# Patient Record
Sex: Female | Born: 1945 | Race: White | Hispanic: No | Marital: Married | Smoking: Former smoker
Health system: Southern US, Community
[De-identification: ages and names within clinical notes are randomized; demographics above are authoritative.]

## PROBLEM LIST (undated history)

## (undated) DIAGNOSIS — F419 Anxiety disorder, unspecified: Secondary | ICD-10-CM

## (undated) DIAGNOSIS — I10 Essential (primary) hypertension: Secondary | ICD-10-CM

## (undated) DIAGNOSIS — I700 Atherosclerosis of aorta: Secondary | ICD-10-CM

## (undated) DIAGNOSIS — I2510 Atherosclerotic heart disease of native coronary artery without angina pectoris: Secondary | ICD-10-CM

## (undated) DIAGNOSIS — E119 Type 2 diabetes mellitus without complications: Secondary | ICD-10-CM

## (undated) DIAGNOSIS — E7800 Pure hypercholesterolemia, unspecified: Secondary | ICD-10-CM

## (undated) DIAGNOSIS — M5416 Radiculopathy, lumbar region: Secondary | ICD-10-CM

## (undated) DIAGNOSIS — M1990 Unspecified osteoarthritis, unspecified site: Secondary | ICD-10-CM

## (undated) DIAGNOSIS — K219 Gastro-esophageal reflux disease without esophagitis: Secondary | ICD-10-CM

## (undated) DIAGNOSIS — R011 Cardiac murmur, unspecified: Secondary | ICD-10-CM

## (undated) DIAGNOSIS — Z87891 Personal history of nicotine dependence: Secondary | ICD-10-CM

## (undated) DIAGNOSIS — N3941 Urge incontinence: Secondary | ICD-10-CM

## (undated) DIAGNOSIS — L309 Dermatitis, unspecified: Secondary | ICD-10-CM

## (undated) DIAGNOSIS — IMO0002 Reserved for concepts with insufficient information to code with codable children: Secondary | ICD-10-CM

## (undated) DIAGNOSIS — Z8719 Personal history of other diseases of the digestive system: Secondary | ICD-10-CM

## (undated) DIAGNOSIS — I491 Atrial premature depolarization: Secondary | ICD-10-CM

## (undated) DIAGNOSIS — F32A Depression, unspecified: Secondary | ICD-10-CM

## (undated) DIAGNOSIS — D3702 Neoplasm of uncertain behavior of tongue: Secondary | ICD-10-CM

## (undated) HISTORY — DX: Cardiac murmur, unspecified: R01.1

## (undated) HISTORY — DX: Atherosclerotic heart disease of native coronary artery without angina pectoris: I25.10

## (undated) HISTORY — PX: COLONOSCOPY: SHX174

## (undated) HISTORY — PX: UPPER GI ENDOSCOPY: SHX6162

## (undated) HISTORY — DX: Atherosclerosis of aorta: I70.0

---

## 1974-02-20 HISTORY — PX: ABDOMINAL HYSTERECTOMY: SHX81

## 1996-02-21 HISTORY — PX: CARPAL TUNNEL RELEASE: SHX101

## 1998-02-20 HISTORY — PX: BREAST BIOPSY: SHX20

## 2005-02-20 HISTORY — PX: CYSTOCELE REPAIR: SHX163

## 2005-02-20 HISTORY — PX: ROTATOR CUFF REPAIR: SHX139

## 2005-10-13 ENCOUNTER — Ambulatory Visit: Payer: Self-pay | Admitting: Unknown Physician Specialty

## 2006-02-15 ENCOUNTER — Inpatient Hospital Stay: Payer: Self-pay | Admitting: Obstetrics & Gynecology

## 2007-09-18 ENCOUNTER — Ambulatory Visit: Payer: Self-pay | Admitting: Family Medicine

## 2008-09-21 ENCOUNTER — Ambulatory Visit: Payer: Self-pay

## 2008-10-16 DIAGNOSIS — Z8241 Family history of sudden cardiac death: Secondary | ICD-10-CM | POA: Insufficient documentation

## 2008-11-09 ENCOUNTER — Ambulatory Visit: Payer: Self-pay | Admitting: Unknown Physician Specialty

## 2011-02-21 HISTORY — PX: KNEE ARTHROSCOPY: SUR90

## 2011-05-05 DIAGNOSIS — M1710 Unilateral primary osteoarthritis, unspecified knee: Secondary | ICD-10-CM | POA: Insufficient documentation

## 2011-08-16 ENCOUNTER — Ambulatory Visit: Payer: Self-pay | Admitting: Unknown Physician Specialty

## 2011-08-18 LAB — PATHOLOGY REPORT

## 2011-10-12 DIAGNOSIS — S83249A Other tear of medial meniscus, current injury, unspecified knee, initial encounter: Secondary | ICD-10-CM | POA: Insufficient documentation

## 2011-12-12 ENCOUNTER — Ambulatory Visit: Payer: Self-pay | Admitting: Family Medicine

## 2012-07-09 ENCOUNTER — Ambulatory Visit: Payer: Self-pay | Admitting: Family Medicine

## 2012-09-04 ENCOUNTER — Institutional Professional Consult (permissible substitution): Payer: Self-pay | Admitting: Internal Medicine

## 2012-09-05 ENCOUNTER — Institutional Professional Consult (permissible substitution): Payer: Self-pay | Admitting: Internal Medicine

## 2013-01-21 ENCOUNTER — Ambulatory Visit: Payer: Self-pay | Admitting: Otolaryngology

## 2013-01-27 ENCOUNTER — Ambulatory Visit: Payer: Self-pay | Admitting: Family Medicine

## 2014-03-19 DIAGNOSIS — K219 Gastro-esophageal reflux disease without esophagitis: Secondary | ICD-10-CM | POA: Diagnosis not present

## 2014-03-19 DIAGNOSIS — Z8719 Personal history of other diseases of the digestive system: Secondary | ICD-10-CM | POA: Diagnosis not present

## 2014-06-08 DIAGNOSIS — M25552 Pain in left hip: Secondary | ICD-10-CM | POA: Diagnosis not present

## 2014-08-06 ENCOUNTER — Other Ambulatory Visit: Payer: Self-pay | Admitting: Family Medicine

## 2014-08-06 DIAGNOSIS — Z1231 Encounter for screening mammogram for malignant neoplasm of breast: Secondary | ICD-10-CM

## 2014-08-11 ENCOUNTER — Other Ambulatory Visit: Payer: Self-pay | Admitting: Family Medicine

## 2014-08-11 ENCOUNTER — Ambulatory Visit
Admission: RE | Admit: 2014-08-11 | Discharge: 2014-08-11 | Disposition: A | Discharge status: Home / Self Care | Payer: Medicare Other | Source: Ambulatory Visit | Attending: Family Medicine | Admitting: Family Medicine

## 2014-08-11 DIAGNOSIS — Z1231 Encounter for screening mammogram for malignant neoplasm of breast: Secondary | ICD-10-CM | POA: Diagnosis not present

## 2014-08-12 ENCOUNTER — Telehealth: Payer: Self-pay

## 2014-08-12 NOTE — Telephone Encounter (Signed)
-----   Message from Margo Common, [place] sent at 08/11/2014  6:46 PM EDT ----- Normal mammograms. Radiologist recommended annual recheck with physical exam.

## 2014-08-12 NOTE — Telephone Encounter (Signed)
Patient advised as directed below. Patient verbalized understanding.  

## 2014-09-02 ENCOUNTER — Telehealth: Payer: Self-pay

## 2014-09-02 ENCOUNTER — Other Ambulatory Visit: Payer: Self-pay

## 2014-09-02 DIAGNOSIS — E7800 Pure hypercholesterolemia, unspecified: Secondary | ICD-10-CM

## 2014-09-02 NOTE — Telephone Encounter (Signed)
Patient states when she came in one Monday that you had mentioned it was time for her labs to be re checked. Patient is requesting a lab

## 2014-09-03 NOTE — Telephone Encounter (Signed)
Advise patient she is due for follow up of cholesterol and schedule follow up appointment to go over lab results in a week or two. Only need to come in to get nurse to release lab requisition before going down stairs for blood draw.

## 2014-09-03 NOTE — Telephone Encounter (Signed)
Patient advised as directed below. Patient verbalized understanding. Patient states she will pick up lab requisition today, printed at the front desk for pick up.

## 2014-09-04 ENCOUNTER — Other Ambulatory Visit: Payer: Self-pay | Admitting: Family Medicine

## 2014-09-05 LAB — CBC WITH DIFFERENTIAL/PLATELET
BASOS ABS: 0 10*3/uL (ref 0.0–0.2)
BASOS: 1 %
EOS (ABSOLUTE): 0.3 10*3/uL (ref 0.0–0.4)
Eos: 6 %
HEMATOCRIT: 38 % (ref 34.0–46.6)
HEMOGLOBIN: 12.8 g/dL (ref 11.1–15.9)
Immature Grans (Abs): 0 10*3/uL (ref 0.0–0.1)
Immature Granulocytes: 0 %
Lymphocytes Absolute: 1.8 10*3/uL (ref 0.7–3.1)
Lymphs: 38 %
MCH: 28.8 pg (ref 26.6–33.0)
MCHC: 33.7 g/dL (ref 31.5–35.7)
MCV: 86 fL (ref 79–97)
MONOS ABS: 0.4 10*3/uL (ref 0.1–0.9)
Monocytes: 10 %
NEUTROS ABS: 2.1 10*3/uL (ref 1.4–7.0)
NEUTROS PCT: 45 %
Platelets: 237 10*3/uL (ref 150–379)
RBC: 4.44 x10E6/uL (ref 3.77–5.28)
RDW: 13.1 % (ref 12.3–15.4)
WBC: 4.6 10*3/uL (ref 3.4–10.8)

## 2014-09-05 LAB — COMPREHENSIVE METABOLIC PANEL
ALBUMIN: 4.2 g/dL (ref 3.6–4.8)
ALT: 21 IU/L (ref 0–32)
AST: 20 IU/L (ref 0–40)
Albumin/Globulin Ratio: 1.4 (ref 1.1–2.5)
Alkaline Phosphatase: 77 IU/L (ref 39–117)
BUN/Creatinine Ratio: 13 (ref 11–26)
BUN: 13 mg/dL (ref 8–27)
Bilirubin Total: 0.3 mg/dL (ref 0.0–1.2)
CALCIUM: 9 mg/dL (ref 8.7–10.3)
CO2: 24 mmol/L (ref 18–29)
CREATININE: 1.01 mg/dL — AB (ref 0.57–1.00)
Chloride: 102 mmol/L (ref 97–108)
GFR calc Af Amer: 66 mL/min/{1.73_m2} (ref >59)
GFR, EST NON AFRICAN AMERICAN: 57 mL/min/{1.73_m2} — AB (ref >59)
GLOBULIN, TOTAL: 3.1 g/dL (ref 1.5–4.5)
GLUCOSE: 134 mg/dL — AB (ref 65–99)
Potassium: 4.6 mmol/L (ref 3.5–5.2)
Sodium: 141 mmol/L (ref 134–144)
TOTAL PROTEIN: 7.3 g/dL (ref 6.0–8.5)

## 2014-09-05 LAB — LIPID PANEL
CHOL/HDL RATIO: 7.3 ratio — AB (ref 0.0–4.4)
Cholesterol, Total: 248 mg/dL — ABNORMAL HIGH (ref 100–199)
HDL: 34 mg/dL — AB (ref >39)
LDL Calculated: 167 mg/dL — ABNORMAL HIGH (ref 0–99)
Triglycerides: 235 mg/dL — ABNORMAL HIGH (ref 0–149)
VLDL Cholesterol Cal: 47 mg/dL — ABNORMAL HIGH (ref 5–40)

## 2014-09-11 ENCOUNTER — Telehealth: Payer: Self-pay

## 2014-09-11 NOTE — Telephone Encounter (Signed)
Advised of results and agreed to call back to make an appointment.

## 2014-10-06 DIAGNOSIS — D3702 Neoplasm of uncertain behavior of tongue: Secondary | ICD-10-CM | POA: Insufficient documentation

## 2014-10-06 DIAGNOSIS — Z87891 Personal history of nicotine dependence: Secondary | ICD-10-CM | POA: Insufficient documentation

## 2014-10-06 DIAGNOSIS — K219 Gastro-esophageal reflux disease without esophagitis: Secondary | ICD-10-CM | POA: Insufficient documentation

## 2014-10-06 DIAGNOSIS — E785 Hyperlipidemia, unspecified: Secondary | ICD-10-CM | POA: Insufficient documentation

## 2014-10-08 ENCOUNTER — Encounter: Payer: Self-pay | Admitting: Family Medicine

## 2014-10-08 ENCOUNTER — Ambulatory Visit (INDEPENDENT_AMBULATORY_CARE_PROVIDER_SITE_OTHER): Payer: Medicare Other | Admitting: Family Medicine

## 2014-10-08 VITALS — BP 140/80 | HR 69 | Temp 98.7°F | Resp 16 | Wt 221.8 lb

## 2014-10-08 DIAGNOSIS — Z8241 Family history of sudden cardiac death: Secondary | ICD-10-CM | POA: Diagnosis not present

## 2014-10-08 DIAGNOSIS — E7800 Pure hypercholesterolemia, unspecified: Secondary | ICD-10-CM

## 2014-10-08 DIAGNOSIS — K209 Esophagitis, unspecified without bleeding: Secondary | ICD-10-CM | POA: Insufficient documentation

## 2014-10-08 DIAGNOSIS — I38 Endocarditis, valve unspecified: Secondary | ICD-10-CM | POA: Insufficient documentation

## 2014-10-08 DIAGNOSIS — K2270 Barrett's esophagus without dysplasia: Secondary | ICD-10-CM | POA: Insufficient documentation

## 2014-10-08 NOTE — Progress Notes (Signed)
Patient: Becky Gordon Female    DOB: 07/19/1945   69 y.o.   MRN: [id number] Visit Date: 10/08/2014  Today's Provider: Vernie Murders, PA   Chief Complaint  Patient presents with  . Follow-up    Cholesterol   Subjective:    HPI This 69 year old female presents for follow up of hyperlipidemia. Still on [place] and trying to follow low fat diet. Has some fatigue and DOE. 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 Plans follow up with Dr. Rockey Situ 11-17-14. Family history of cardiovascular disease in mother and 2 sisters.   Patient Active Problem List   Diagnosis Date Noted  . Acid reflux 10/06/2014  . Personal history of tobacco use, presenting hazards to health 10/06/2014  . HLD (hyperlipidemia) 10/06/2014  . 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 H/O gastrointestinal disease 03/19/2014  . Current tear knee, medial meniscus 10/12/2011  . Arthritis of knee, degenerative 05/05/2011  . Family history of sudden cardiac death (SCD) 2008-10-31  . Hypercholesterolemia without hypertriglyceridemia 08/17/2006   Past Surgical History  Procedure Laterality Date  . Breast biopsy Right 2000    negative  . Abdominal hysterectomy  1976  . Knee arthroscopy Left 2013  . Cystocele repair  2007  . Breast biopsy Right 2004  . Carpal tunnel release  1998  . Rotator cuff repair  2007   Family History  Problem Relation Age of Onset  . Heart attack Mother   . Cancer Father   . Heart attack Sister   . CAD Sister   . Kidney failure Sister   . Asthma Sister   . CAD Sister   . Heart disease Sister    Allergies  Allergen Reactions  . Celecoxib   . Etodolac   . Prednisone Hives   Previous Medications   ASPIRIN 81 MG EC TABLET    1 tablet daily.   BIOTIN 10 MG TABS    Take 1 tablet by mouth daily.   CALCIUM CARBONATE (OS-CAL) 600 MG TABS TABLET    Take 2 tablets by mouth daily.   CVS OMEGA-3 KRILL OIL PO    Take  by mouth daily.   FOLIC ACID (FOLVITE) 573 MCG TABLET    Take 1 tablet by mouth daily.   MULTIPLE VITAMIN PO    1 tablet daily.   OMEPRAZOLE 20 MG TBEC    Take 1 tablet by mouth daily.   RED YEAST RICE 600 MG CAPS    Take 1 capsule by mouth daily.   Review of Systems  Constitutional: Negative.   HENT: Negative.   Eyes: Negative.   Respiratory: Negative.   Cardiovascular: Negative.   Gastrointestinal: Negative.   Genitourinary: Negative.   Musculoskeletal: Negative.   Allergic/Immunologic: Negative.   Neurological: Negative.   Hematological: Negative.   Psychiatric/Behavioral: Negative.    Social History  Substance Use Topics  . Smoking status: Former Research scientist (life sciences)  . Smokeless tobacco: Never Used     Comment: QUIT IN 80  . Alcohol Use: No   Objective:   BP 140/80 mmHg  Pulse 69  Temp(Src) 98.7 F (37.1 C) (Oral)  Resp 16  Wt 221 lb 12.8 oz (100.608 kg)  Physical Exam  Constitutional: She is oriented to person, place, and time. She appears well-developed and well-nourished. No distress.  HENT:  Head: Normocephalic and atraumatic.  Right Ear: Hearing normal.  Left Ear: Hearing normal.  Nose:  Nose normal.  Eyes: Conjunctivae, EOM and lids are normal. Pupils are equal, round, and reactive to light. Right eye exhibits no discharge. Left eye exhibits no discharge. No scleral icterus.  Neck: Normal range of motion. Neck supple.  Cardiovascular: Normal rate and regular rhythm.   Pulmonary/Chest: Effort normal and breath sounds normal. No respiratory distress.  Abdominal: Soft. Bowel sounds are normal.  Musculoskeletal: Normal range of motion.  Mild crepitus in both knees. Minimal puffiness of knees and ankles. Good pulses throughout.  Neurological: She is alert and oriented to person, place, and time.  Skin: Skin is intact. No lesion and no rash noted.  Psychiatric: She has a normal mood and affect. Her speech is normal and behavior is normal. Thought content normal.        Assessment & Plan:     1. Hypercholesterolemia without hypertriglyceridemia Labs from 09-04-14 as documented below. Still not willing to consider statin for hyperlipidemia. Wants to try Red Yeast Rice and Omega-3 Krill Oil with diet for now. Encouraged to discuss with cardiologist (Dr. Candis Musa) Recent Results (from the past 2160 hour(s))  CBC with Differential/Platelet     Status: None   Collection Time: 09/04/14  8:28 AM  Result Value Ref Range   WBC 4.6 3.4 - 10.8 x10E3/uL   RBC 4.44 3.77 - 5.28 x10E6/uL   Hemoglobin 12.8 11.1 - 15.9 g/dL   Hematocrit 38.0 34.0 - 46.6 %   MCV 86 79 - 97 fL   MCH 28.8 26.6 - 33.0 pg   MCHC 33.7 31.5 - 35.7 g/dL   RDW 13.1 12.3 - 15.4 %   Platelets 237 150 - 379 x10E3/uL   Neutrophils 45 %   Lymphs 38 %   Monocytes 10 %   Eos 6 %   Basos 1 %   Neutrophils Absolute 2.1 1.4 - 7.0 x10E3/uL   Lymphocytes Absolute 1.8 0.7 - 3.1 x10E3/uL   Monocytes Absolute 0.4 0.1 - 0.9 x10E3/uL   EOS (ABSOLUTE) 0.3 0.0 - 0.4 x10E3/uL   Basophils Absolute 0.0 0.0 - 0.2 x10E3/uL   Immature Granulocytes 0 %   Immature Grans (Abs) 0.0 0.0 - 0.1 x10E3/uL  Comprehensive metabolic panel     Status: Abnormal   Collection Time: 09/04/14  8:28 AM  Result Value Ref Range   Glucose 134 (H) 65 - 99 mg/dL   BUN 13 8 - 27 mg/dL   Creatinine, Ser 1.01 (H) 0.57 - 1.00 mg/dL   GFR calc non Af Amer 57 (L) >59 mL/min/1.73   GFR calc Af Amer 66 >59 mL/min/1.73   BUN/Creatinine Ratio 13 11 - 26   Sodium 141 134 - 144 mmol/L   Potassium 4.6 3.5 - 5.2 mmol/L   Chloride 102 97 - 108 mmol/L   CO2 24 18 - 29 mmol/L   Calcium 9.0 8.7 - 10.3 mg/dL   Total Protein 7.3 6.0 - 8.5 g/dL   Albumin 4.2 3.6 - 4.8 g/dL   Globulin, Total 3.1 1.5 - 4.5 g/dL   Albumin/Globulin Ratio 1.4 1.1 - 2.5   Bilirubin Total 0.3 0.0 - 1.2 mg/dL   Alkaline Phosphatase 77 39 - 117 IU/L   AST 20 0 - 40 IU/L   ALT 21 0 - 32 IU/L  Lipid panel     Status: Abnormal   Collection Time: 09/04/14  8:28 AM  Result  Value Ref Range   Cholesterol, Total 248 (H) 100 - 199 mg/dL   Triglycerides 235 (H) 0 - 149 mg/dL  HDL 34 (L) >39 mg/dL    Comment: According to ATP-III Guidelines, HDL-C >59 mg/dL is considered a negative risk factor for CHD.    VLDL Cholesterol Cal 47 (H) 5 - 40 mg/dL   LDL Calculated 167 (H) 0 - 99 mg/dL   Chol/HDL Ratio 7.3 (H) 0.0 - 4.4 ratio units    Comment:                                   T. Chol/HDL Ratio                                             Men  Women                               1/2 Avg.Risk  3.4    3.3                                   Avg.Risk  5.0    4.4                                2X Avg.Risk  9.6    7.1                                3X Avg.Risk 23.4   11.0     2. Family history of sudden cardiac death (SCD) Mother and sisters have had SCD. Has a history of cardiovalvular disease. Will recheck with Dr. Rockey Situ (cardiologist) in a month.  3. Barrett's esophagus with esophagitis Diagnosed by EGD per Dr. Vira Agar (gastroenterologist) in 2013. Continues to use Omeprazole 20 mg qd to control any dyspepsia/reflux.  4. 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 Proceed with cardiology follow up as planned.       Vernie Murders, PA  [facility]

## 2014-11-17 ENCOUNTER — Encounter: Payer: Self-pay | Admitting: Cardiovascular Disease

## 2014-11-17 ENCOUNTER — Ambulatory Visit (INDEPENDENT_AMBULATORY_CARE_PROVIDER_SITE_OTHER): Payer: Medicare Other | Admitting: Cardiovascular Disease

## 2014-11-17 ENCOUNTER — Encounter (INDEPENDENT_AMBULATORY_CARE_PROVIDER_SITE_OTHER): Payer: Self-pay

## 2014-11-17 VITALS — BP 158/70 | HR 67 | Ht 65.0 in | Wt 221.5 lb

## 2014-11-17 DIAGNOSIS — R079 Chest pain, unspecified: Secondary | ICD-10-CM | POA: Diagnosis not present

## 2014-11-17 DIAGNOSIS — R519 Headache, unspecified: Secondary | ICD-10-CM

## 2014-11-17 DIAGNOSIS — R000 Tachycardia, unspecified: Secondary | ICD-10-CM

## 2014-11-17 DIAGNOSIS — E785 Hyperlipidemia, unspecified: Secondary | ICD-10-CM

## 2014-11-17 DIAGNOSIS — Z87891 Personal history of nicotine dependence: Secondary | ICD-10-CM

## 2014-11-17 DIAGNOSIS — R51 Headache: Secondary | ICD-10-CM

## 2014-11-17 DIAGNOSIS — I209 Angina pectoris, unspecified: Secondary | ICD-10-CM

## 2014-11-17 DIAGNOSIS — R0602 Shortness of breath: Secondary | ICD-10-CM | POA: Diagnosis not present

## 2014-11-17 NOTE — Assessment & Plan Note (Signed)
Long smoking history places her at increased risk of underlying coronary artery disease and PAD This was discussed with her

## 2014-11-17 NOTE — Progress Notes (Signed)
Patient ID: Becky Gordon, female    DOB: 09/20/1945, 69 y.o.   MRN: [id number]  HPI Comments: Becky Gordon is a pleasant 69 year old woman with long history of smoking for 40 years, hyperlipidemia, anxiety, strong family history of coronary artery disease, presenting with symptoms of shortness of breath with exertion, discomfort in her jaw/face with walking.  She reports that she is concerned about coronary artery disease given her strong family history. In the past she has declined a cholesterol medication. Previously had trouble on Lipitor. Reports having significant GERD symptoms. She was taking fish oil at the same time. Currently she takes high-dose fish oil and complains of the burping and fish taste.  She's not reactive, reports that she is limited by her shortness of breath when she exerts herself. She is able to work for long hours in her garden, , more difficulty with aerobic activity. Sometimes when she exerts herself, she has discomfort up in her face.  Because of the symptoms she does not do any regular walking or exercise. She has gained significant weight over the years. Particularly since she stopped smoking  He denies any chest discomfort with exertion, only limited by her breathing. She reports previous attempt for treadmill study but was unsuccessful secondary to shortness of breath and had to stop the test.  EKG on today's visit shows normal sinus rhythm with rate 67 bpm, no significant ST or T-wave changes  In terms of her family history, she reports mother had major MI at age 70, sister had cardiac arrhythmia and died, father had cancer   Allergies  Allergen Reactions  . Celecoxib   . Etodolac   . Prednisone Hives    Current Outpatient Prescriptions on File Prior to Visit  Medication Sig Dispense Refill  . aspirin 81 MG EC tablet 1 tablet daily.    . Biotin 10 MG TABS Take 1 tablet by mouth daily.    . calcium carbonate (OS-CAL) 600 MG TABS tablet Take 2  tablets by mouth daily.    . CVS OMEGA-3 KRILL OIL PO Take by mouth daily.    . folic acid (FOLVITE) 937 MCG tablet Take 1 tablet by mouth daily.    . MULTIPLE VITAMIN PO 1 tablet daily.    . Omeprazole 20 MG TBEC Take 1 tablet by mouth daily.     No current facility-administered medications on file prior to visit.    History reviewed. No pertinent past medical history.  Past Surgical History  Procedure Laterality Date  . Breast biopsy Right 2000    negative  . Abdominal hysterectomy  1976  . Knee arthroscopy Left 2013  . Cystocele repair  2007  . Breast biopsy Right 2004  . Carpal tunnel release  1998  . Rotator cuff repair  2007    Social History  reports that she has quit smoking. Her smoking use included Cigarettes. She quit after 0 years of use. She has never used smokeless tobacco. She reports that she does not drink alcohol or use illicit drugs.  Family History family history includes Asthma in her sister; CAD in her sister and sister; Cancer in her father; Heart attack in her mother and sister; Heart disease in her sister; Kidney failure in her sister.    Review of Systems  Constitutional: Negative.        Tightness in her face with exertion  Respiratory: Positive for shortness of breath.   Cardiovascular: Negative.   Gastrointestinal: Negative.   Musculoskeletal: Negative.  Neurological: Negative.   Hematological: Negative.   Psychiatric/Behavioral: Negative.   All other systems reviewed and are negative.   BP 158/70 mmHg  Pulse 67  Ht 5\' 5"  (1.651 m)  Wt 221 lb 8 oz (100.472 kg)  BMI 36.86 kg/m2  Physical Exam  Constitutional: She is oriented to person, place, and time. She appears well-developed and well-nourished.  Obese  HENT:  Head: Normocephalic.  Nose: Nose normal.  Mouth/Throat: Oropharynx is clear and moist.  Eyes: Conjunctivae are normal. Pupils are equal, round, and reactive to light.  Neck: Normal range of motion. Neck supple. No JVD  present.  Cardiovascular: Normal rate, regular rhythm, normal heart sounds and intact distal pulses.  Exam reveals no gallop and no friction rub.   No murmur heard. Pulmonary/Chest: Effort normal and breath sounds normal. No respiratory distress. She has no wheezes. She has no rales. She exhibits no tenderness.  Abdominal: Soft. Bowel sounds are normal. She exhibits no distension. There is no tenderness.  Musculoskeletal: Normal range of motion. She exhibits no edema or tenderness.  Lymphadenopathy:    She has no cervical adenopathy.  Neurological: She is alert and oriented to person, place, and time. Coordination normal.  Skin: Skin is warm and dry. No rash noted. No erythema.  Psychiatric: She has a normal mood and affect. Her behavior is normal. Judgment and thought content normal.

## 2014-11-17 NOTE — Assessment & Plan Note (Signed)
Discussed her high cholesterol with her. Will use the calcium scores a guide and could start low-dose Crestor for elevated calcium score

## 2014-11-17 NOTE — Patient Instructions (Addendum)
  No medication changes were made.  You are scheduled for a CT coronary calcium score in [place] for chest pain, shortness of breath, angina pectoris Wednesday,Sept 28 @ 4:00, please arrive @ 3:45 There is a one-time fee of $150.00 due at the time of your procedure  Please call us if you have new issues that need to be addressed before your next appt.  Your physician wants you to follow-up in: 1 month.     Coronary Calcium Scan A coronary calcium scan is an imaging test used to look for deposits of calcium and other fatty materials (plaques) in the inner lining of the blood vessels of your heart (coronary arteries). These deposits of calcium and plaques can partly clog and narrow the coronary arteries without producing any symptoms or warning signs. This puts you at risk for a heart attack. This test can detect these deposits before symptoms develop.  LET [facility] CARE PROVIDER KNOW ABOUT:  Any allergies you have.  All medicines you are taking, including vitamins, herbs, eye drops, creams, and over-the-counter medicines.  Previous problems you or members of your family have had with the use of anesthetics.  Any blood disorders you have.  Previous surgeries you have had.  Medical conditions you have.  Possibility of pregnancy, if this applies. RISKS AND COMPLICATIONS Generally, this is a safe procedure. However, as with any procedure, complications can occur. This test involves the use of radiation. Radiation exposure can be dangerous to a pregnant woman and her unborn baby. If you are pregnant, you should not have this procedure done.  BEFORE THE PROCEDURE There is no special preparation for the procedure. PROCEDURE  You will need to undress and put on a hospital gown. You will need to remove any jewelry around your neck or chest.  Sticky electrodes are placed on your chest and are connected to an electrocardiogram (EKG or electrocardiography) machine to recorda tracing of the  electrical activity of your heart.  A CT scanner will take pictures of your heart. During this time, you will be asked to lie still and hold your breath for 2-3 seconds while a picture is being taken of your heart. AFTER THE PROCEDURE   You will be allowed to get dressed.  You can return to your normal activities after the scan is done. Document Released: 08/05/2007 Document Revised: 02/11/2013 Document Reviewed: 10/14/2012 [facility] Patient Information 2015 [place], [place]. This information is not intended to replace advice given to you by your health care provider. Make sure you discuss any questions you have with your health care provider.

## 2014-11-17 NOTE — Assessment & Plan Note (Signed)
Etiology of her shortness of breath is unclear She is obese, deconditioned Unable to exclude underlying coronary disease/angina as a cause of her symptoms We'll try to obtain prior records from [place]. She reports having a stress test but was unable to treadmill and test was terminated. After long discussion, given her risk factors and family history, we have recommended a CT coronary calcium score for risk stratification. Pharmacologic Myoview could be done but would be limited by breast attenuation artifact

## 2014-11-17 NOTE — Assessment & Plan Note (Signed)
She is concerned about her symptoms and states being unable to feel comfortable doing an exercise program. If the scan above shows minimal calcified coronary atherosclerosis, would encourage her to start a regular exercise program for weight loss

## 2014-11-17 NOTE — Assessment & Plan Note (Signed)
Etiology of her discomfort in the face with exertion is unclear. Unable to exclude angina equivalent.  CT coronary calcium score ordered as above. Recommended if symptoms get worse that she call our office.

## 2014-11-18 ENCOUNTER — Ambulatory Visit (INDEPENDENT_AMBULATORY_CARE_PROVIDER_SITE_OTHER)
Admission: RE | Admit: 2014-11-18 | Discharge: 2014-11-18 | Disposition: A | Discharge status: Home / Self Care | Payer: Medicare Other | Source: Ambulatory Visit | Attending: Cardiovascular Disease | Admitting: Cardiovascular Disease

## 2014-11-18 DIAGNOSIS — R000 Tachycardia, unspecified: Secondary | ICD-10-CM

## 2014-11-18 DIAGNOSIS — R079 Chest pain, unspecified: Secondary | ICD-10-CM

## 2014-11-18 DIAGNOSIS — R0602 Shortness of breath: Secondary | ICD-10-CM

## 2014-11-18 DIAGNOSIS — I209 Angina pectoris, unspecified: Secondary | ICD-10-CM

## 2014-11-25 ENCOUNTER — Other Ambulatory Visit: Payer: No Typology Code available for payment source

## 2015-01-05 ENCOUNTER — Ambulatory Visit: Payer: No Typology Code available for payment source | Admitting: Cardiovascular Disease

## 2015-08-27 DIAGNOSIS — K219 Gastro-esophageal reflux disease without esophagitis: Secondary | ICD-10-CM | POA: Diagnosis not present

## 2015-08-27 DIAGNOSIS — I10 Essential (primary) hypertension: Secondary | ICD-10-CM | POA: Diagnosis not present

## 2015-08-27 DIAGNOSIS — E669 Obesity, unspecified: Secondary | ICD-10-CM | POA: Diagnosis not present

## 2015-08-27 DIAGNOSIS — E784 Other hyperlipidemia: Secondary | ICD-10-CM | POA: Diagnosis not present

## 2015-09-01 DIAGNOSIS — K219 Gastro-esophageal reflux disease without esophagitis: Secondary | ICD-10-CM | POA: Diagnosis not present

## 2015-09-01 DIAGNOSIS — E784 Other hyperlipidemia: Secondary | ICD-10-CM | POA: Diagnosis not present

## 2015-09-01 DIAGNOSIS — I10 Essential (primary) hypertension: Secondary | ICD-10-CM | POA: Diagnosis not present

## 2015-09-01 DIAGNOSIS — E669 Obesity, unspecified: Secondary | ICD-10-CM | POA: Diagnosis not present

## 2015-09-01 DIAGNOSIS — M810 Age-related osteoporosis without current pathological fracture: Secondary | ICD-10-CM | POA: Diagnosis not present

## 2015-09-01 DIAGNOSIS — M8580 Other specified disorders of bone density and structure, unspecified site: Secondary | ICD-10-CM | POA: Diagnosis not present

## 2015-09-01 LAB — HM DEXA SCAN: HM Dexa Scan: NORMAL

## 2015-09-03 ENCOUNTER — Other Ambulatory Visit: Payer: Self-pay | Admitting: Family Medicine

## 2015-09-03 DIAGNOSIS — E784 Other hyperlipidemia: Secondary | ICD-10-CM | POA: Diagnosis not present

## 2015-09-03 DIAGNOSIS — K219 Gastro-esophageal reflux disease without esophagitis: Secondary | ICD-10-CM | POA: Diagnosis not present

## 2015-09-03 DIAGNOSIS — E669 Obesity, unspecified: Secondary | ICD-10-CM | POA: Diagnosis not present

## 2015-09-03 DIAGNOSIS — I10 Essential (primary) hypertension: Secondary | ICD-10-CM | POA: Diagnosis not present

## 2015-09-03 DIAGNOSIS — Z1231 Encounter for screening mammogram for malignant neoplasm of breast: Secondary | ICD-10-CM

## 2015-09-03 DIAGNOSIS — M8580 Other specified disorders of bone density and structure, unspecified site: Secondary | ICD-10-CM | POA: Diagnosis not present

## 2015-09-21 ENCOUNTER — Ambulatory Visit
Admission: RE | Admit: 2015-09-21 | Discharge: 2015-09-21 | Disposition: A | Discharge status: Home / Self Care | Payer: Medicare Other | Source: Ambulatory Visit | Attending: Family Medicine | Admitting: Family Medicine

## 2015-09-21 ENCOUNTER — Other Ambulatory Visit: Payer: Self-pay | Admitting: Family Medicine

## 2015-09-21 DIAGNOSIS — Z1231 Encounter for screening mammogram for malignant neoplasm of breast: Secondary | ICD-10-CM | POA: Diagnosis not present

## 2015-10-26 ENCOUNTER — Other Ambulatory Visit: Payer: Self-pay

## 2015-11-25 DIAGNOSIS — I10 Essential (primary) hypertension: Secondary | ICD-10-CM | POA: Diagnosis not present

## 2015-11-25 DIAGNOSIS — E784 Other hyperlipidemia: Secondary | ICD-10-CM | POA: Diagnosis not present

## 2015-11-25 DIAGNOSIS — K219 Gastro-esophageal reflux disease without esophagitis: Secondary | ICD-10-CM | POA: Diagnosis not present

## 2015-11-25 DIAGNOSIS — E669 Obesity, unspecified: Secondary | ICD-10-CM | POA: Diagnosis not present

## 2015-11-30 ENCOUNTER — Telehealth: Payer: Self-pay | Admitting: Family Medicine

## 2015-11-30 NOTE — Telephone Encounter (Signed)
Called Pt to schedule AWV with [facility] and CPE with PCP for 10/17 -knb

## 2015-12-01 DIAGNOSIS — E784 Other hyperlipidemia: Secondary | ICD-10-CM | POA: Diagnosis not present

## 2015-12-01 DIAGNOSIS — E1165 Type 2 diabetes mellitus with hyperglycemia: Secondary | ICD-10-CM | POA: Diagnosis not present

## 2015-12-01 DIAGNOSIS — I10 Essential (primary) hypertension: Secondary | ICD-10-CM | POA: Diagnosis not present

## 2015-12-01 DIAGNOSIS — M8580 Other specified disorders of bone density and structure, unspecified site: Secondary | ICD-10-CM | POA: Diagnosis not present

## 2015-12-01 DIAGNOSIS — E669 Obesity, unspecified: Secondary | ICD-10-CM | POA: Diagnosis not present

## 2015-12-01 DIAGNOSIS — E118 Type 2 diabetes mellitus with unspecified complications: Secondary | ICD-10-CM | POA: Diagnosis not present

## 2015-12-01 DIAGNOSIS — R739 Hyperglycemia, unspecified: Secondary | ICD-10-CM | POA: Diagnosis not present

## 2015-12-01 DIAGNOSIS — K219 Gastro-esophageal reflux disease without esophagitis: Secondary | ICD-10-CM | POA: Diagnosis not present

## 2015-12-14 DIAGNOSIS — E784 Other hyperlipidemia: Secondary | ICD-10-CM | POA: Diagnosis not present

## 2015-12-14 DIAGNOSIS — M8580 Other specified disorders of bone density and structure, unspecified site: Secondary | ICD-10-CM | POA: Diagnosis not present

## 2015-12-14 DIAGNOSIS — I10 Essential (primary) hypertension: Secondary | ICD-10-CM | POA: Diagnosis not present

## 2015-12-14 DIAGNOSIS — K219 Gastro-esophageal reflux disease without esophagitis: Secondary | ICD-10-CM | POA: Diagnosis not present

## 2015-12-14 DIAGNOSIS — E1165 Type 2 diabetes mellitus with hyperglycemia: Secondary | ICD-10-CM | POA: Diagnosis not present

## 2015-12-14 DIAGNOSIS — E118 Type 2 diabetes mellitus with unspecified complications: Secondary | ICD-10-CM | POA: Diagnosis not present

## 2015-12-14 DIAGNOSIS — E669 Obesity, unspecified: Secondary | ICD-10-CM | POA: Diagnosis not present

## 2015-12-28 DIAGNOSIS — E669 Obesity, unspecified: Secondary | ICD-10-CM | POA: Diagnosis not present

## 2015-12-28 DIAGNOSIS — M8580 Other specified disorders of bone density and structure, unspecified site: Secondary | ICD-10-CM | POA: Diagnosis not present

## 2015-12-28 DIAGNOSIS — I10 Essential (primary) hypertension: Secondary | ICD-10-CM | POA: Diagnosis not present

## 2015-12-28 DIAGNOSIS — E118 Type 2 diabetes mellitus with unspecified complications: Secondary | ICD-10-CM | POA: Diagnosis not present

## 2015-12-28 DIAGNOSIS — E1165 Type 2 diabetes mellitus with hyperglycemia: Secondary | ICD-10-CM | POA: Diagnosis not present

## 2015-12-28 DIAGNOSIS — K219 Gastro-esophageal reflux disease without esophagitis: Secondary | ICD-10-CM | POA: Diagnosis not present

## 2015-12-28 DIAGNOSIS — E784 Other hyperlipidemia: Secondary | ICD-10-CM | POA: Diagnosis not present

## 2016-01-05 DIAGNOSIS — Z1211 Encounter for screening for malignant neoplasm of colon: Secondary | ICD-10-CM | POA: Diagnosis not present

## 2016-02-02 ENCOUNTER — Telehealth: Payer: Self-pay | Admitting: Family Medicine

## 2016-02-02 NOTE — Telephone Encounter (Signed)
Called Pt to schedule AWV with [facility] - knb °

## 2016-02-07 DIAGNOSIS — E1165 Type 2 diabetes mellitus with hyperglycemia: Secondary | ICD-10-CM | POA: Diagnosis not present

## 2016-02-15 DIAGNOSIS — K219 Gastro-esophageal reflux disease without esophagitis: Secondary | ICD-10-CM | POA: Diagnosis not present

## 2016-02-15 DIAGNOSIS — E1165 Type 2 diabetes mellitus with hyperglycemia: Secondary | ICD-10-CM | POA: Diagnosis not present

## 2016-02-15 DIAGNOSIS — E784 Other hyperlipidemia: Secondary | ICD-10-CM | POA: Diagnosis not present

## 2016-02-15 DIAGNOSIS — I10 Essential (primary) hypertension: Secondary | ICD-10-CM | POA: Diagnosis not present

## 2016-02-15 DIAGNOSIS — M8580 Other specified disorders of bone density and structure, unspecified site: Secondary | ICD-10-CM | POA: Diagnosis not present

## 2016-02-15 DIAGNOSIS — E118 Type 2 diabetes mellitus with unspecified complications: Secondary | ICD-10-CM | POA: Diagnosis not present

## 2016-02-15 DIAGNOSIS — E669 Obesity, unspecified: Secondary | ICD-10-CM | POA: Diagnosis not present

## 2016-05-16 ENCOUNTER — Ambulatory Visit (INDEPENDENT_AMBULATORY_CARE_PROVIDER_SITE_OTHER): Payer: Medicare Other | Admitting: Family Medicine

## 2016-05-16 ENCOUNTER — Encounter: Payer: Self-pay | Admitting: Family Medicine

## 2016-05-16 ENCOUNTER — Other Ambulatory Visit: Payer: Self-pay

## 2016-05-16 ENCOUNTER — Telehealth: Payer: Self-pay | Admitting: Family Medicine

## 2016-05-16 VITALS — BP 138/78 | HR 64 | Temp 98.0°F | Wt 207.2 lb

## 2016-05-16 DIAGNOSIS — E669 Obesity, unspecified: Secondary | ICD-10-CM | POA: Diagnosis not present

## 2016-05-16 DIAGNOSIS — N6310 Unspecified lump in the right breast, unspecified quadrant: Secondary | ICD-10-CM

## 2016-05-16 DIAGNOSIS — E1165 Type 2 diabetes mellitus with hyperglycemia: Secondary | ICD-10-CM | POA: Diagnosis not present

## 2016-05-16 DIAGNOSIS — M8580 Other specified disorders of bone density and structure, unspecified site: Secondary | ICD-10-CM | POA: Diagnosis not present

## 2016-05-16 DIAGNOSIS — E784 Other hyperlipidemia: Secondary | ICD-10-CM | POA: Diagnosis not present

## 2016-05-16 NOTE — Telephone Encounter (Signed)
Mammogram ordered

## 2016-05-16 NOTE — Telephone Encounter (Signed)
Per York Cerise at [facility] a diagnostic right breast mammogram order is needed [id number]

## 2016-05-16 NOTE — Telephone Encounter (Signed)
Please order

## 2016-05-16 NOTE — Progress Notes (Addendum)
Patient: Becky Gordon Female    DOB: [birth year]   71 y.o.   MRN: [id number] Visit Date: 05/16/2016  Today's Provider: Vernie Murders, PA   Chief Complaint  Patient presents with  . Breast Mass   Subjective:    HPI Patient is here to discuss a possible right breast lump. Patient reports she noticed soreness and a knot last week, but no pain. Last mammogram was 09/2015.  Patient Active Problem List   Diagnosis Date Noted  . Morbid obesity ([place]) 11/17/2014  . Shortness of breath 11/17/2014  . Discomfort of face 11/17/2014  . Barrett's esophagus with esophagitis 10/08/2014  . Valvular disease 10/08/2014  . Acid reflux 10/06/2014  . Personal history of tobacco use, presenting hazards to health 10/06/2014  . HLD (hyperlipidemia) 10/06/2014  . 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 H/O gastrointestinal disease 03/19/2014  . Current tear knee, medial meniscus 10/12/2011  . Arthritis of knee, degenerative 05/05/2011  . Family history of sudden cardiac death (SCD) 10-29-2008  . Hypercholesterolemia without hypertriglyceridemia 08/17/2006   Past Surgical History:  Procedure Laterality Date  . ABDOMINAL HYSTERECTOMY  1976  . BREAST BIOPSY Right 2000   negative  . BREAST BIOPSY Right 2004  . CARPAL TUNNEL RELEASE  1998  . CYSTOCELE REPAIR  2007  . KNEE ARTHROSCOPY Left 2013  . ROTATOR CUFF REPAIR  2007   Family History  Problem Relation Age of Onset  . Heart attack Mother   . Cancer Father   . Heart attack Sister   . CAD Sister   . Kidney failure Sister   . Asthma Sister   . CAD Sister   . Heart disease Sister   . Breast cancer Neg Hx    Allergies  Allergen Reactions  . Celecoxib   . Etodolac   . Prednisone Hives     Previous Medications   AMLODIPINE (NORVASC) 5 MG TABLET    Take by mouth.   ASPIRIN 81 MG EC TABLET    1 tablet daily.   BIOTIN 10 MG TABS    Take 1 tablet by mouth daily.   CALCIUM CARBONATE (OS-CAL) 600 MG TABS TABLET    Take 2  tablets by mouth daily.   CVS OMEGA-3 KRILL OIL PO    Take by mouth daily.   EMPAGLIFLOZIN-METFORMIN HCL 5-500 MG TABS    Take by mouth.   FOLIC ACID (FOLVITE) 790 MCG TABLET    Take 1 tablet by mouth daily.   MULTIPLE VITAMIN PO    1 tablet daily.   OMEPRAZOLE 20 MG TBEC    Take 1 tablet by mouth daily.   SIMVASTATIN (ZOCOR) 40 MG TABLET    Take by mouth.    Review of Systems  Constitutional: Negative.   Respiratory: Negative.   Cardiovascular: Negative.   Skin:       Breast lump     Social History  Substance Use Topics  . Smoking status: Former Smoker    Years: 0.00    Types: Cigarettes  . Smokeless tobacco: Never Used     Comment: QUIT IN 32  . Alcohol use No   Objective:   BP 138/78 (BP Location: Right Arm, Patient Position: Sitting, Cuff Size: Normal)   Pulse 64   Temp 98 F (36.7 C) (Oral)   Wt 207 lb 3.2 oz (94 kg)   SpO2 97%   BMI 34.48 kg/m   Physical Exam  Constitutional: She is oriented to person, place, and time.  She appears well-developed and well-nourished. No distress.  HENT:  Head: Normocephalic and atraumatic.  Right Ear: Hearing normal.  Left Ear: Hearing normal.  Nose: Nose normal.  Eyes: Conjunctivae and lids are normal. Right eye exhibits no discharge. Left eye exhibits no discharge. No scleral icterus.  Pulmonary/Chest: Effort normal. No respiratory distress.  Tender right breast mass at deep 3 o'clock position. No nipple discharge or local lymphadenopathy.  Musculoskeletal: Normal range of motion.  Neurological: She is alert and oriented to person, place, and time.  Skin: Skin is intact. No lesion and no rash noted.  Psychiatric: She has a normal mood and affect. Her speech is normal and behavior is normal. Thought content normal.   Depression screen [facility] 2/9 05/16/2016  Decreased Interest 0  Down, Depressed, Hopeless 0  PHQ - 2 Score 0  Altered sleeping 0  Tired, decreased energy 0  Change in appetite 0  Feeling bad or failure about  yourself  0  Trouble concentrating 0  Moving slowly or fidgety/restless 0  Suicidal thoughts 0  PHQ-9 Score 0  Difficult doing work/chores Not difficult at all       Assessment & Plan:     1. Breast mass, right First noticed the past week. Had normal screening mammograms 09-21-15. No nipple discharge and worried about a cancer. Family history of negative for breast cancer. Has a history of right breast lump biopsies in 2000 and 2004 (both negative). Schedule ultrasound evaluation. Restrict coffee/caffeine in diet. Recheck pending report. - US BREAST LTD UNI RIGHT INC AXILLA

## 2016-05-24 DIAGNOSIS — M8580 Other specified disorders of bone density and structure, unspecified site: Secondary | ICD-10-CM | POA: Diagnosis not present

## 2016-05-24 DIAGNOSIS — E1165 Type 2 diabetes mellitus with hyperglycemia: Secondary | ICD-10-CM | POA: Diagnosis not present

## 2016-05-24 DIAGNOSIS — E118 Type 2 diabetes mellitus with unspecified complications: Secondary | ICD-10-CM | POA: Diagnosis not present

## 2016-05-24 DIAGNOSIS — E784 Other hyperlipidemia: Secondary | ICD-10-CM | POA: Diagnosis not present

## 2016-05-24 DIAGNOSIS — K219 Gastro-esophageal reflux disease without esophagitis: Secondary | ICD-10-CM | POA: Diagnosis not present

## 2016-05-24 DIAGNOSIS — I10 Essential (primary) hypertension: Secondary | ICD-10-CM | POA: Diagnosis not present

## 2016-05-24 DIAGNOSIS — E669 Obesity, unspecified: Secondary | ICD-10-CM | POA: Diagnosis not present

## 2016-05-29 ENCOUNTER — Ambulatory Visit
Admission: RE | Admit: 2016-05-29 | Discharge: 2016-05-29 | Disposition: A | Discharge status: Home / Self Care | Payer: Medicare Other | Source: Ambulatory Visit | Attending: Family Medicine | Admitting: Family Medicine

## 2016-05-29 DIAGNOSIS — N6310 Unspecified lump in the right breast, unspecified quadrant: Secondary | ICD-10-CM | POA: Diagnosis not present

## 2016-05-29 DIAGNOSIS — R928 Other abnormal and inconclusive findings on diagnostic imaging of breast: Secondary | ICD-10-CM | POA: Diagnosis not present

## 2016-05-29 NOTE — Progress Notes (Signed)
Advised  ED 

## 2016-06-02 ENCOUNTER — Encounter: Payer: Self-pay | Admitting: *Deleted

## 2016-06-05 ENCOUNTER — Ambulatory Visit: Payer: Medicare Other | Admitting: Certified Registered Nurse Anesthetist

## 2016-06-05 ENCOUNTER — Ambulatory Visit
Admission: RE | Admit: 2016-06-05 | Discharge: 2016-06-05 | Disposition: A | Discharge status: Home / Self Care | Payer: Medicare Other | Source: Ambulatory Visit | Attending: Unknown Physician Specialty | Admitting: Unknown Physician Specialty

## 2016-06-05 ENCOUNTER — Encounter: Payer: Self-pay | Admitting: Certified Registered Nurse Anesthetist

## 2016-06-05 ENCOUNTER — Encounter
Admission: RE | Disposition: A | Discharge status: Home / Self Care | Payer: Self-pay | Source: Ambulatory Visit | Attending: Unknown Physician Specialty

## 2016-06-05 DIAGNOSIS — Z1211 Encounter for screening for malignant neoplasm of colon: Secondary | ICD-10-CM | POA: Diagnosis not present

## 2016-06-05 DIAGNOSIS — K635 Polyp of colon: Secondary | ICD-10-CM | POA: Diagnosis not present

## 2016-06-05 DIAGNOSIS — K649 Unspecified hemorrhoids: Secondary | ICD-10-CM | POA: Diagnosis not present

## 2016-06-05 DIAGNOSIS — E119 Type 2 diabetes mellitus without complications: Secondary | ICD-10-CM | POA: Diagnosis not present

## 2016-06-05 DIAGNOSIS — K648 Other hemorrhoids: Secondary | ICD-10-CM | POA: Diagnosis not present

## 2016-06-05 DIAGNOSIS — K640 First degree hemorrhoids: Secondary | ICD-10-CM | POA: Diagnosis not present

## 2016-06-05 DIAGNOSIS — Z7982 Long term (current) use of aspirin: Secondary | ICD-10-CM | POA: Insufficient documentation

## 2016-06-05 DIAGNOSIS — K621 Rectal polyp: Secondary | ICD-10-CM | POA: Diagnosis not present

## 2016-06-05 DIAGNOSIS — K644 Residual hemorrhoidal skin tags: Secondary | ICD-10-CM | POA: Diagnosis not present

## 2016-06-05 DIAGNOSIS — M1990 Unspecified osteoarthritis, unspecified site: Secondary | ICD-10-CM | POA: Insufficient documentation

## 2016-06-05 DIAGNOSIS — Z90710 Acquired absence of both cervix and uterus: Secondary | ICD-10-CM | POA: Insufficient documentation

## 2016-06-05 DIAGNOSIS — K219 Gastro-esophageal reflux disease without esophagitis: Secondary | ICD-10-CM | POA: Diagnosis not present

## 2016-06-05 DIAGNOSIS — Z87891 Personal history of nicotine dependence: Secondary | ICD-10-CM | POA: Diagnosis not present

## 2016-06-05 DIAGNOSIS — D125 Benign neoplasm of sigmoid colon: Secondary | ICD-10-CM | POA: Diagnosis not present

## 2016-06-05 DIAGNOSIS — Z8249 Family history of ischemic heart disease and other diseases of the circulatory system: Secondary | ICD-10-CM | POA: Insufficient documentation

## 2016-06-05 DIAGNOSIS — D128 Benign neoplasm of rectum: Secondary | ICD-10-CM | POA: Diagnosis not present

## 2016-06-05 DIAGNOSIS — I10 Essential (primary) hypertension: Secondary | ICD-10-CM | POA: Insufficient documentation

## 2016-06-05 HISTORY — DX: Gastro-esophageal reflux disease without esophagitis: K21.9

## 2016-06-05 HISTORY — DX: Atrial premature depolarization: I49.1

## 2016-06-05 HISTORY — DX: Reserved for concepts with insufficient information to code with codable children: IMO0002

## 2016-06-05 HISTORY — DX: Essential (primary) hypertension: I10

## 2016-06-05 HISTORY — PX: COLONOSCOPY WITH PROPOFOL: SHX5780

## 2016-06-05 HISTORY — DX: Unspecified osteoarthritis, unspecified site: M19.90

## 2016-06-05 HISTORY — DX: Type 2 diabetes mellitus without complications: E11.9

## 2016-06-05 HISTORY — DX: Personal history of other diseases of the digestive system: Z87.19

## 2016-06-05 HISTORY — DX: Dermatitis, unspecified: L30.9

## 2016-06-05 LAB — GLUCOSE, CAPILLARY: GLUCOSE-CAPILLARY: 117 mg/dL — AB (ref 65–99)

## 2016-06-05 LAB — SURGICAL PATHOLOGY

## 2016-06-05 SURGERY — COLONOSCOPY WITH PROPOFOL
Anesthesia: General

## 2016-06-05 MED ORDER — SODIUM CHLORIDE 0.9 % IV SOLN: INTRAVENOUS | Status: DC

## 2016-06-05 MED ORDER — GLYCOPYRROLATE 0.2 MG/ML IJ SOLN
INTRAMUSCULAR | Status: AC
  Filled 2016-06-05: qty 1

## 2016-06-05 MED ORDER — ONDANSETRON HCL 4 MG/2ML IJ SOLN
INTRAMUSCULAR | Status: DC | PRN
Start: 2016-06-05 — End: 2016-06-05
  Administered 2016-06-05: 4 mg via INTRAVENOUS

## 2016-06-05 MED ORDER — EPHEDRINE SULFATE 50 MG/ML IJ SOLN
INTRAMUSCULAR | Status: AC
  Filled 2016-06-05: qty 1

## 2016-06-05 MED ORDER — PROPOFOL 10 MG/ML IV BOLUS
INTRAVENOUS | Status: AC
  Filled 2016-06-05: qty 20

## 2016-06-05 MED ORDER — PHENYLEPHRINE HCL 10 MG/ML IJ SOLN
INTRAMUSCULAR | Status: AC
  Filled 2016-06-05: qty 1

## 2016-06-05 MED ORDER — PROPOFOL 10 MG/ML IV BOLUS
INTRAVENOUS | Status: DC | PRN
Start: 2016-06-05 — End: 2016-06-05
  Administered 2016-06-05: 30 mg via INTRAVENOUS

## 2016-06-05 MED ORDER — EPHEDRINE SULFATE 50 MG/ML IJ SOLN
INTRAMUSCULAR | Status: DC | PRN
  Administered 2016-06-05: 10 mg via INTRAVENOUS

## 2016-06-05 MED ORDER — SODIUM CHLORIDE 0.9 % IV SOLN
INTRAVENOUS | Status: DC
  Administered 2016-06-05: 07:00:00 via INTRAVENOUS

## 2016-06-05 MED ORDER — MIDAZOLAM HCL 2 MG/2ML IJ SOLN
INTRAMUSCULAR | Status: AC
  Filled 2016-06-05: qty 2

## 2016-06-05 MED ORDER — PROPOFOL 500 MG/50ML IV EMUL
INTRAVENOUS | Status: DC | PRN
  Administered 2016-06-05: 140 ug/kg/min via INTRAVENOUS

## 2016-06-05 MED ORDER — PROPOFOL 500 MG/50ML IV EMUL
INTRAVENOUS | Status: AC
  Filled 2016-06-05: qty 50

## 2016-06-05 MED ORDER — MIDAZOLAM HCL 2 MG/2ML IJ SOLN
INTRAMUSCULAR | Status: DC | PRN
Start: 2016-06-05 — End: 2016-06-05
  Administered 2016-06-05: 2 mg via INTRAVENOUS

## 2016-06-05 MED ORDER — GLYCOPYRROLATE 0.2 MG/ML IJ SOLN
INTRAMUSCULAR | Status: DC | PRN
  Administered 2016-06-05: 0.2 mg via INTRAVENOUS

## 2016-06-05 MED ORDER — LIDOCAINE HCL (PF) 2 % IJ SOLN
INTRAMUSCULAR | Status: AC
  Filled 2016-06-05: qty 2

## 2016-06-05 MED ORDER — ONDANSETRON HCL 4 MG/2ML IJ SOLN
INTRAMUSCULAR | Status: AC
  Filled 2016-06-05: qty 2

## 2016-06-05 MED ORDER — LIDOCAINE HCL (CARDIAC) 20 MG/ML IV SOLN
INTRAVENOUS | Status: DC | PRN
  Administered 2016-06-05: 50 mg via INTRAVENOUS

## 2016-06-05 NOTE — Anesthesia Post-op Follow-up Note (Cosign Needed)
Anesthesia QCDR form completed.        

## 2016-06-05 NOTE — H&P (Signed)
Primary Care Physician:  Vernie Murders, PA Primary Gastroenterologist:  Dr. Vira Agar  Pre-Procedure History & Physical: HPI:  Becky Gordon is a 71 y.o. female is here for an colonoscopy.   Past Medical History:  Diagnosis Date  . Arthritis    OSTEOARTHRITIS  . Diabetes mellitus without complication ([place])   . Eczema   . GERD (gastroesophageal reflux disease)   . History of Barrett's esophagus   . Hypertension   . PAC (premature atrial contraction)   . Ulcer     Past Surgical History:  Procedure Laterality Date  . ABDOMINAL HYSTERECTOMY  1976  . BREAST BIOPSY Right 2000   negative  . BREAST BIOPSY Right 2004  . CARPAL TUNNEL RELEASE  1998  . COLONOSCOPY    . CYSTOCELE REPAIR  2007  . KNEE ARTHROSCOPY Left 2013  . ROTATOR CUFF REPAIR  2007  . UPPER GI ENDOSCOPY      Prior to Admission medications   Medication Sig Start Date End Date Taking? Authorizing Provider  amLODipine (NORVASC) 5 MG tablet Take by mouth. 12/02/15   Historical Provider, MD  aspirin 81 MG EC tablet 1 tablet daily. 09/13/07   Historical Provider, MD  Biotin 10 MG TABS Take 1 tablet by mouth daily.    Historical Provider, MD  calcium carbonate (OS-CAL) 600 MG TABS tablet Take 2 tablets by mouth daily.    Historical Provider, MD  [employer] OMEGA-3 KRILL OIL PO Take by mouth daily.    Historical Provider, MD  Empagliflozin-Metformin HCl 5-500 MG TABS Take by mouth.    Historical Provider, MD  folic acid (FOLVITE) 378 MCG tablet Take 1 tablet by mouth daily.    Historical Provider, MD  MULTIPLE VITAMIN PO 1 tablet daily. 09/13/07   Historical Provider, MD  Omeprazole 20 MG TBEC Take 1 tablet by mouth daily.    Historical Provider, MD  simvastatin (ZOCOR) 40 MG tablet Take by mouth. 12/02/15   Historical Provider, MD    Allergies as of 03/15/2016 - Review Complete 11/17/2014  Allergen Reaction Noted  . Celecoxib  10/06/2014  . Etodolac  10/06/2014  . Prednisone Hives 10/06/2014    Family History   Problem Relation Age of Onset  . Heart attack Mother   . Cancer Father   . Heart attack Sister   . CAD Sister   . Kidney failure Sister   . Asthma Sister   . CAD Sister   . Heart disease Sister   . Breast cancer Neg Hx     Social History   Social History  . Marital status: Married    Spouse name: N/A  . Number of children: N/A  . Years of education: N/A   Occupational History  . Not on file.   Social History Main Topics  . Smoking status: Former Smoker    Years: 0.00    Types: Cigarettes  . Smokeless tobacco: Never Used     Comment: QUIT IN 56  . Alcohol use No  . Drug use: No  . Sexual activity: Not on file   Other Topics Concern  . Not on file   Social History Narrative  . No narrative on file    Review of Systems: See HPI, otherwise negative ROS  Physical Exam: BP 132/71   Pulse 60   Temp 97.6 F (36.4 C) (Oral)   Resp 16   Ht 5\' 6"  (1.676 m)   Wt 92.1 kg (203 lb)   SpO2 100%   BMI 32.77  kg/m  General:   Alert,  pleasant and cooperative in NAD Head:  Normocephalic and atraumatic. Neck:  Supple; no masses or thyromegaly. Lungs:  Clear throughout to auscultation.    Heart:  Regular rate and rhythm. Abdomen:  Soft, nontender and nondistended. Normal bowel sounds, without guarding, and without rebound.   Neurologic:  Alert and  oriented x4;  grossly normal neurologically.  Impression/Plan: Becky Gordon is here for an colonoscopy to be performed for screening colonoscopy  Risks, benefits, limitations, and alternatives regarding  colonoscopy have been reviewed with the patient.  Questions have been answered.  All parties agreeable.   Gaylyn Cheers, MD  06/05/2016, 7:29 AM

## 2016-06-05 NOTE — Anesthesia Procedure Notes (Signed)
Date/Time: 06/05/2016 7:33 AM Performed by: Johnna Acosta Pre-anesthesia Checklist: Patient identified, Emergency Drugs available, Suction available, Patient being monitored and Timeout performed Patient Re-evaluated:Patient Re-evaluated prior to inductionOxygen Delivery Method: Nasal cannula

## 2016-06-05 NOTE — Op Note (Signed)
[facility] Gastroenterology Patient Name: Becky Gordon Procedure Date: 06/05/2016 7:31 AM MRN: [id number] Account #: [id number] Date of Birth: 1945-08-18 Admit Type: Outpatient Age: 71 Room: [facility] ENDO ROOM 1 Gender: Female Note Status: Finalized Procedure:            Colonoscopy Indications:          Screening for colorectal malignant neoplasm Providers:            Manya Silvas, MD Referring MD:         Vickki Muff. Chrismon, MD (Referring MD) Medicines:            Propofol per Anesthesia, Ondansetron 4 mg IV Complications:        No immediate complications. Procedure:            Pre-Anesthesia Assessment:                       - After reviewing the risks and benefits, the patient                        was deemed in satisfactory condition to undergo the                        procedure.                       After obtaining informed consent, the colonoscope was                        passed under direct vision. Throughout the procedure,                        the patient's blood pressure, pulse, and oxygen                        saturations were monitored continuously. The                        Colonoscope was introduced through the anus and                        advanced to the the cecum, identified by appendiceal                        orifice and ileocecal valve. The colonoscopy was                        performed without difficulty. The patient tolerated the                        procedure fairly well. The quality of the bowel                        preparation was excellent. Findings:      Patient had a vagal response with a drop in heart rate to 30, this       occured when going around a corner and was quickly treated by CRNA with       ephedrine and Robinal and returned to normal rapidly and remained normal       the rest of the procedure.      Three sessile polyps were found  in the rectum and sigmoid colon. The       polyps were diminutive in  size. These polyps were removed with a jumbo       cold forceps. Resection and retrieval were complete.      External and internal hemorrhoids were found during endoscopy. The       hemorrhoids were small and Grade I (internal hemorrhoids that do not       prolapse).      The exam was otherwise without abnormality. Impression:           - Three diminutive polyps in the rectum and in the                        sigmoid colon, removed with a jumbo cold forceps.                        Resected and retrieved.                       - External and internal hemorrhoids.                       - The examination was otherwise normal. Recommendation:       - Await pathology results. Manya Silvas, MD 06/05/2016 8:06:58 AM This report has been signed electronically. Number of Addenda: 0 Note Initiated On: 06/05/2016 7:31 AM Scope Withdrawal Time: 0 hours 10 minutes 6 seconds  Total Procedure Duration: 0 hours 21 minutes 56 seconds       [facility]

## 2016-06-05 NOTE — Anesthesia Preprocedure Evaluation (Signed)
Anesthesia Evaluation  Patient identified by MRN, date of birth, ID band Patient awake    Reviewed: Allergy & Precautions, H&P , NPO status , Patient's Chart, lab work & pertinent test results, reviewed documented beta blocker date and time   History of Anesthesia Complications (+) PONV and history of anesthetic complications  Airway Mallampati: III  TM Distance: >3 FB Neck ROM: full    Dental  (+) Teeth Intact, Caps   Pulmonary shortness of breath and with exertion, neg sleep apnea, neg COPD, neg recent URI, former smoker,           Cardiovascular Exercise Tolerance: Good hypertension, (-) angina(-) CAD, (-) Past MI, (-) Cardiac Stents and (-) CABG (-) dysrhythmias (-) Valvular Problems/Murmurs     Neuro/Psych negative neurological ROS  negative psych ROS   GI/Hepatic Neg liver ROS, GERD  ,  Endo/Other  diabetes  Renal/GU negative Renal ROS  negative genitourinary   Musculoskeletal   Abdominal   Peds  Hematology negative hematology ROS (+)   Anesthesia Other Findings Past Medical History: No date: Arthritis     Comment: OSTEOARTHRITIS No date: Diabetes mellitus without complication (H[facility]) No date: Eczema No date: GERD (gastroesophageal reflux disease) No date: History of Barrett's esophagus No date: Hypertension No date: PAC (premature atrial contraction) No date: Ulcer   Reproductive/Obstetrics negative OB ROS                             Anesthesia Physical Anesthesia Plan  ASA: III  Anesthesia Plan: General   Post-op Pain Management:    Induction:   Airway Management Planned:   Additional Equipment:   Intra-op Plan:   Post-operative Plan:   Informed Consent: I have reviewed the patients History and Physical, chart, labs and discussed the procedure including the risks, benefits and alternatives for the proposed anesthesia with the patient or authorized representative  who has indicated his/her understanding and acceptance.   Dental Advisory Given  Plan Discussed with: Anesthesiologist, CRNA and Surgeon  Anesthesia Plan Comments:         Anesthesia Quick Evaluation

## 2016-06-05 NOTE — Anesthesia Postprocedure Evaluation (Signed)
Anesthesia Post Note  Patient: Becky Gordon  Procedure(s) Performed: Procedure(s) (LRB): COLONOSCOPY WITH PROPOFOL (N/A)  Patient location during evaluation: Endoscopy Anesthesia Type: General Level of consciousness: awake and alert Pain management: pain level controlled Vital Signs Assessment: post-procedure vital signs reviewed and stable Respiratory status: spontaneous breathing, nonlabored ventilation, respiratory function stable and patient connected to nasal cannula oxygen Cardiovascular status: blood pressure returned to baseline and stable Postop Assessment: no signs of nausea or vomiting Anesthetic complications: no     Last Vitals:  Vitals:   06/05/16 0826 06/05/16 0836  BP: 105/62 121/67  Pulse: 73 69  Resp: 14 18  Temp:      Last Pain:  Vitals:   06/05/16 0806  TempSrc: Temporal                 Martha Clan

## 2016-06-05 NOTE — Transfer of Care (Signed)
Immediate Anesthesia Transfer of Care Note  Patient: Becky Gordon  Procedure(s) Performed: Procedure(s): COLONOSCOPY WITH PROPOFOL (N/A)  Patient Location: PACU  Anesthesia Type:General  Level of Consciousness: sedated  Airway & Oxygen Therapy: Patient Spontanous Breathing and Patient connected to nasal cannula oxygen  Post-op Assessment: Report given to RN and Post -op Vital signs reviewed and stable  Post vital signs: Reviewed and stable  Last Vitals:  Vitals:   06/05/16 0805 06/05/16 0806  BP: (!) 97/42 (!) 97/42  Pulse: 73 72  Resp: 15 15  Temp: 36.1 C 36.1 C    Last Pain:  Vitals:   06/05/16 0806  TempSrc: Temporal         Complications: No apparent anesthesia complications

## 2016-06-06 ENCOUNTER — Encounter: Payer: Self-pay | Admitting: Unknown Physician Specialty

## 2016-09-08 DIAGNOSIS — E1165 Type 2 diabetes mellitus with hyperglycemia: Secondary | ICD-10-CM | POA: Diagnosis not present

## 2016-09-12 DIAGNOSIS — E1165 Type 2 diabetes mellitus with hyperglycemia: Secondary | ICD-10-CM | POA: Diagnosis not present

## 2016-09-12 DIAGNOSIS — L03115 Cellulitis of right lower limb: Secondary | ICD-10-CM | POA: Diagnosis not present

## 2016-09-12 DIAGNOSIS — E118 Type 2 diabetes mellitus with unspecified complications: Secondary | ICD-10-CM | POA: Diagnosis not present

## 2016-09-12 DIAGNOSIS — T798XXA Other early complications of trauma, initial encounter: Secondary | ICD-10-CM | POA: Diagnosis not present

## 2016-09-12 DIAGNOSIS — E784 Other hyperlipidemia: Secondary | ICD-10-CM | POA: Diagnosis not present

## 2016-09-12 DIAGNOSIS — I10 Essential (primary) hypertension: Secondary | ICD-10-CM | POA: Diagnosis not present

## 2016-09-12 DIAGNOSIS — K219 Gastro-esophageal reflux disease without esophagitis: Secondary | ICD-10-CM | POA: Diagnosis not present

## 2016-09-12 DIAGNOSIS — E669 Obesity, unspecified: Secondary | ICD-10-CM | POA: Diagnosis not present

## 2016-09-12 DIAGNOSIS — M8580 Other specified disorders of bone density and structure, unspecified site: Secondary | ICD-10-CM | POA: Diagnosis not present

## 2016-09-12 DIAGNOSIS — L03818 Cellulitis of other sites: Secondary | ICD-10-CM | POA: Diagnosis not present

## 2016-09-15 DIAGNOSIS — E784 Other hyperlipidemia: Secondary | ICD-10-CM | POA: Diagnosis not present

## 2016-09-15 DIAGNOSIS — K219 Gastro-esophageal reflux disease without esophagitis: Secondary | ICD-10-CM | POA: Diagnosis not present

## 2016-09-15 DIAGNOSIS — E1165 Type 2 diabetes mellitus with hyperglycemia: Secondary | ICD-10-CM | POA: Diagnosis not present

## 2016-09-15 DIAGNOSIS — M96831 Postprocedural hemorrhage and hematoma of a musculoskeletal structure following other procedure: Secondary | ICD-10-CM | POA: Diagnosis not present

## 2016-09-15 DIAGNOSIS — E669 Obesity, unspecified: Secondary | ICD-10-CM | POA: Diagnosis not present

## 2016-09-15 DIAGNOSIS — M8580 Other specified disorders of bone density and structure, unspecified site: Secondary | ICD-10-CM | POA: Diagnosis not present

## 2016-09-15 DIAGNOSIS — L03115 Cellulitis of right lower limb: Secondary | ICD-10-CM | POA: Diagnosis not present

## 2016-09-15 DIAGNOSIS — I10 Essential (primary) hypertension: Secondary | ICD-10-CM | POA: Diagnosis not present

## 2016-11-14 DIAGNOSIS — Z1211 Encounter for screening for malignant neoplasm of colon: Secondary | ICD-10-CM | POA: Diagnosis not present

## 2016-11-14 DIAGNOSIS — Z0000 Encounter for general adult medical examination without abnormal findings: Secondary | ICD-10-CM | POA: Diagnosis not present

## 2016-11-14 DIAGNOSIS — Z23 Encounter for immunization: Secondary | ICD-10-CM | POA: Diagnosis not present

## 2016-12-18 DIAGNOSIS — M8580 Other specified disorders of bone density and structure, unspecified site: Secondary | ICD-10-CM | POA: Diagnosis not present

## 2016-12-18 DIAGNOSIS — E669 Obesity, unspecified: Secondary | ICD-10-CM | POA: Diagnosis not present

## 2016-12-18 DIAGNOSIS — E1165 Type 2 diabetes mellitus with hyperglycemia: Secondary | ICD-10-CM | POA: Diagnosis not present

## 2016-12-18 DIAGNOSIS — E7849 Other hyperlipidemia: Secondary | ICD-10-CM | POA: Diagnosis not present

## 2016-12-18 DIAGNOSIS — K219 Gastro-esophageal reflux disease without esophagitis: Secondary | ICD-10-CM | POA: Diagnosis not present

## 2016-12-18 DIAGNOSIS — I10 Essential (primary) hypertension: Secondary | ICD-10-CM | POA: Diagnosis not present

## 2016-12-18 DIAGNOSIS — E7800 Pure hypercholesterolemia, unspecified: Secondary | ICD-10-CM | POA: Diagnosis not present

## 2017-01-23 DIAGNOSIS — R0989 Other specified symptoms and signs involving the circulatory and respiratory systems: Secondary | ICD-10-CM | POA: Diagnosis not present

## 2017-01-23 DIAGNOSIS — R9431 Abnormal electrocardiogram [ECG] [EKG]: Secondary | ICD-10-CM | POA: Diagnosis not present

## 2017-03-12 DIAGNOSIS — E7800 Pure hypercholesterolemia, unspecified: Secondary | ICD-10-CM | POA: Diagnosis not present

## 2017-03-12 DIAGNOSIS — E1165 Type 2 diabetes mellitus with hyperglycemia: Secondary | ICD-10-CM | POA: Diagnosis not present

## 2017-11-06 ENCOUNTER — Other Ambulatory Visit: Payer: Self-pay | Admitting: Endocrinology

## 2017-11-06 DIAGNOSIS — Z1231 Encounter for screening mammogram for malignant neoplasm of breast: Secondary | ICD-10-CM

## 2017-11-13 ENCOUNTER — Ambulatory Visit
Admission: RE | Admit: 2017-11-13 | Discharge: 2017-11-13 | Disposition: A | Discharge status: Home / Self Care | Payer: Medicare HMO | Source: Ambulatory Visit | Attending: Endocrinology | Admitting: Endocrinology

## 2017-11-13 DIAGNOSIS — Z1231 Encounter for screening mammogram for malignant neoplasm of breast: Secondary | ICD-10-CM | POA: Diagnosis present

## 2018-10-07 ENCOUNTER — Other Ambulatory Visit: Payer: Self-pay | Admitting: Family Medicine

## 2018-10-07 ENCOUNTER — Ambulatory Visit
Admission: RE | Admit: 2018-10-07 | Discharge: 2018-10-07 | Disposition: A | Discharge status: Home / Self Care | Payer: Medicare HMO | Source: Ambulatory Visit | Attending: Family Medicine | Admitting: Family Medicine

## 2018-10-07 ENCOUNTER — Other Ambulatory Visit: Payer: Self-pay

## 2018-10-07 DIAGNOSIS — M7989 Other specified soft tissue disorders: Secondary | ICD-10-CM

## 2018-11-05 ENCOUNTER — Encounter: Payer: Self-pay | Admitting: Obstetrics & Gynecology

## 2018-11-12 ENCOUNTER — Other Ambulatory Visit: Payer: Self-pay

## 2018-11-12 ENCOUNTER — Ambulatory Visit (INDEPENDENT_AMBULATORY_CARE_PROVIDER_SITE_OTHER): Payer: Medicare HMO | Admitting: Obstetrics & Gynecology

## 2018-11-12 ENCOUNTER — Encounter: Payer: Self-pay | Admitting: Obstetrics & Gynecology

## 2018-11-12 VITALS — BP 138/80 | Ht 65.5 in | Wt 214.0 lb

## 2018-11-12 DIAGNOSIS — Z9889 Other specified postprocedural states: Secondary | ICD-10-CM

## 2018-11-12 DIAGNOSIS — N3941 Urge incontinence: Secondary | ICD-10-CM | POA: Diagnosis not present

## 2018-11-12 MED ORDER — MIRABEGRON ER 25 MG PO TB24: 25.0000 mg | ORAL_TABLET | Freq: Every day | ORAL | 6 refills | Status: DC

## 2018-11-12 NOTE — Progress Notes (Signed)
Gynecology Evaluation   Chief Complaint: Leakage of urine  History of Present Illness:   Patient is a 73 y.o. [id number], presents today for a problem visit.  The patient is menopausal. She complains of urinary symptoms of urinary frequency, urinary incontinence and urinary urgency. Patient reports her symptoms began several months ago and its severity is described as moderate.  Her symptoms include frequency, incontinence and urgency.  She is not having leakage of urine with stressful activities, such as coughing, sneezing, laughter, or exercise.  She is having leakage of urine based on symptoms of urgency or bladder spasm/discomfort.  She is having nocturia, with 3 episodes per night.  She is using pads or diapers daily for control of symptoms. She is not having dysuria.  She has not had a history of UTI.  She drinks about 2 caffeinated drinks per day.  She has had prior procedures for incontinence (2007 TVT sling).  Prior hysterectomy.  Also reports back oain, may have disc dz or other dx, has not fully had this investigated.  PMHx: She  has a past medical history of Arthritis, Diabetes mellitus without complication ([place]), Eczema, GERD (gastroesophageal reflux disease), History of Barrett's esophagus, Hypertension, PAC (premature atrial contraction), and Ulcer. Also,  has a past surgical history that includes Abdominal hysterectomy (1976); Knee arthroscopy (Left, 2013); Cystocele repair (2007); Carpal tunnel release (1998); Rotator cuff repair (2007); Upper gi endoscopy; Colonoscopy; Colonoscopy with propofol (N/A, 06/05/2016); and Breast biopsy (Right, 2000)., family history includes Asthma in her sister; CAD in her sister and sister; Cancer in her father; Heart attack in her mother and sister; Heart disease in her sister; Kidney failure in her sister.,  reports that she has quit smoking. Her smoking use included cigarettes. She quit after 0.00 years of use. She has never used smokeless tobacco. She  reports that she does not drink alcohol or use drugs.  She has a current medication list which includes the following prescription(s): amlodipine, aspirin, biotin, calcium carbonate, krill oil, empagliflozin-metformin hcl, folic acid, mirabegron er, multiple vitamin, omeprazole, and simvastatin. Also, is allergic to celecoxib; etodolac; and prednisone.  Review of Systems  Constitutional: Positive for malaise/fatigue. Negative for chills and fever.  HENT: Negative for congestion, sinus pain and sore throat.   Eyes: Negative for blurred vision and pain.  Respiratory: Negative for cough and wheezing.   Cardiovascular: Negative for chest pain and leg swelling.  Gastrointestinal: Negative for abdominal pain, constipation, diarrhea, heartburn, nausea and vomiting.  Genitourinary: Positive for frequency and urgency. Negative for dysuria and hematuria.  Musculoskeletal: Positive for back pain and joint pain. Negative for myalgias and neck pain.  Skin: Negative for itching and rash.  Neurological: Negative for dizziness, tremors and weakness.  Endo/Heme/Allergies: Does not bruise/bleed easily.  Psychiatric/Behavioral: Negative for depression. The patient is not nervous/anxious and does not have insomnia.     Objective: BP 138/80   Ht 5' 5.5" (1.664 m)   Wt 214 lb (97.1 kg)   BMI 35.07 kg/m  Physical Exam Constitutional:      General: She is not in acute distress.    Appearance: She is well-developed.  Genitourinary:     Pelvic exam was performed with patient supine.     Vagina normal.     No vaginal erythema or bleeding.     Genitourinary Comments: Cuff intact/ no lesions Gr 2 rectocele No cystocele Absent uterus and cervix  HENT:     Head: Normocephalic and atraumatic.     Nose: Nose normal.  Abdominal:     General: There is no distension.     Palpations: Abdomen is soft.     Tenderness: There is no abdominal tenderness.  Musculoskeletal: Normal range of motion.  Neurological:      Mental Status: She is alert and oriented to person, place, and time.     Cranial Nerves: No cranial nerve deficit.  Skin:    General: Skin is warm and dry.  Psychiatric:        Attention and Perception: Attention normal.        Mood and Affect: Mood normal.        Speech: Speech normal.        Behavior: Behavior normal.        Cognition and Memory: Cognition normal.        Judgment: Judgment normal.    Female chaperone present for pelvic portion of the physical exam  Assessment: 73 y.o. [id number] for urologic evaluation of incontinence 1. Urgency incontinence - will try meds first - needs back investigation by PCP/specialist; may contribute to etiology of OAB - mirabegron ER (MYRBETRIQ) 25 MG TB24 tablet; Take 1 tablet (25 mg total) by mouth daily.  Dispense: 30 tablet; Refill: 6  2. History of suburethral sling procedure - no s/sx GSI; well healed   Barnett Applebaum, MD, [facility], [facility] 11/12/2018  2:45 PM

## 2018-11-12 NOTE — Patient Instructions (Signed)
Overactive Bladder, Adult  Overactive bladder refers to a condition in which a person has a sudden need to pass urine. The person may leak urine if he or she cannot get to the bathroom fast enough (urinary incontinence). A person with this condition may also wake up several times in the night to go to the bathroom. Overactive bladder is associated with poor nerve signals between your bladder and your brain. Your bladder may get the signal to empty before it is full. You may also have very sensitive muscles that make your bladder squeeze too soon. These symptoms might interfere with daily work or social activities. What are the causes? This condition may be associated with or caused by:  Urinary tract infection.  Infection of nearby tissues, such as the prostate.  Prostate enlargement.  Surgery on the uterus or urethra.  Bladder stones, inflammation, or tumors.  Drinking too much caffeine or alcohol.  Certain medicines, especially medicines that get rid of extra fluid in the body (diuretics).  Muscle or nerve weakness, especially from: ? A spinal cord injury. ? Stroke. ? Multiple sclerosis. ? Parkinson's disease.  Diabetes.  Constipation. What increases the risk? You may be at greater risk for overactive bladder if you:  Are an older adult.  Smoke.  Are going through menopause.  Have prostate problems.  Have a neurological disease, such as stroke, dementia, Parkinson's disease, or multiple sclerosis (MS).  Eat or drink things that irritate the bladder. These include alcohol, spicy food, and caffeine.  Are overweight or obese. What are the signs or symptoms? Symptoms of this condition include:  Sudden, strong urge to urinate.  Leaking urine.  Urinating 8 or more times a day.  Waking up to urinate 2 or more times a night. How is this diagnosed? Your health care provider may suspect overactive bladder based on your symptoms. He or she will diagnose this condition  by:  A physical exam and medical history.  Blood or urine tests. You might need bladder or urine tests to help determine what is causing your overactive bladder. You might also need to see a health care provider who specializes in urinary tract problems (urologist). How is this treated? Treatment for overactive bladder depends on the cause of your condition and whether it is mild or severe. You can also make lifestyle changes at home. Options include:  Bladder training. This may include: ? Learning to control the urge to urinate by following a schedule that directs you to urinate at regular intervals (timed voiding). ? Doing Kegel exercises to strengthen your pelvic floor muscles, which support your bladder. Toning these muscles can help you control urination, even if your bladder muscles are overactive.  Special devices. This may include: ? Biofeedback, which uses sensors to help you become aware of your body's signals. ? Electrical stimulation, which uses electrodes placed inside the body (implanted) or outside the body. These electrodes send gentle pulses of electricity to strengthen the nerves or muscles that control the bladder. ? Women may use a plastic device that fits into the vagina and supports the bladder (pessary).  Medicines. ? Antibiotics to treat bladder infection. ? Antispasmodics to stop the bladder from releasing urine at the wrong time. ? Tricyclic antidepressants to relax bladder muscles. ? Injections of botulinum toxin type A directly into the bladder tissue to relax bladder muscles.  Lifestyle changes. This may include: ? Weight loss. Talk to your health care provider about weight loss methods that would work best for you. ?   Diet changes. This may include reducing how much alcohol and caffeine you consume, or drinking fluids at different times of the day. ? Not smoking. Do not use any products that contain nicotine or tobacco, such as cigarettes and e-cigarettes. If  you need help quitting, ask your health care provider.  Surgery. ? A device may be implanted to help manage the nerve signals that control urination. ? An electrode may be implanted to stimulate electrical signals in the bladder. ? A procedure may be done to change the shape of the bladder. This is done only in very severe cases. Follow these instructions at home: Lifestyle  Make any diet or lifestyle changes that are recommended by your health care provider. These may include: ? Drinking less fluid or drinking fluids at different times of the day. ? Cutting down on caffeine or alcohol. ? Doing Kegel exercises. ? Losing weight if needed. ? Eating a healthy and balanced diet to prevent constipation. This may include:  Eating foods that are high in fiber, such as fresh fruits and vegetables, whole grains, and beans.  Limiting foods that are high in fat and processed sugars, such as fried and sweet foods. General instructions  Take over-the-counter and prescription medicines only as told by your health care provider.  If you were prescribed an antibiotic medicine, take it as told by your health care provider. Do not stop taking the antibiotic even if you start to feel better.  Use any implants or pessary as told by your health care provider.  If needed, wear pads to absorb urine leakage.  Keep a journal or log to track how much and when you drink and when you feel the need to urinate. This will help your health care provider monitor your condition.  Keep all follow-up visits as told by your health care provider. This is important. Contact a health care provider if:  You have a fever.  Your symptoms do not get better with treatment.  Your pain and discomfort get worse.  You have more frequent urges to urinate. Get help right away if:  You are not able to control your bladder. Summary  Overactive bladder refers to a condition in which a person has a sudden need to pass urine.   Several conditions may lead to an overactive bladder.  Treatment for overactive bladder depends on the cause and severity of your condition.  Follow your health care provider's instructions about lifestyle changes, doing Kegel exercises, keeping a journal, and taking medicines. This information is not intended to replace advice given to you by your health care provider. Make sure you discuss any questions you have with your health care provider. Document Released: 12/03/2008 Document Revised: 05/30/2018 Document Reviewed: 02/22/2017 Elsevier Patient Education  2020 Elsevier [employer].  Mirabegron extended-release tablets What is this medicine? MIRABEGRON (MIR a BEG ron) is used to treat overactive bladder. This medicine reduces the amount of bathroom visits. It may also help to control wetting accidents. It may be used alone, but sometimes may be given with other treatments. This medicine may be used for other purposes; ask your health care provider or pharmacist if you have questions. COMMON BRAND NAME(S): Myrbetriq What should I tell my health care provider before I take this medicine? They need to know if you have any of these conditions:  high blood pressure  kidney disease  liver disease  problems urinating  prostate disease  an unusual or allergic reaction to mirabegron, other medicines, foods, dyes, or preservatives    pregnant or trying to get pregnant  breast-feeding How should I use this medicine? Take this medicine by mouth with a glass of water. Follow the directions on the prescription label. Do not cut, crush or chew this medicine. You can take it with or without food. If it upsets your stomach, take it with food. Take your medicine at regular intervals. Do not take it more often than directed. Do not stop taking except on your doctor's advice. Talk to your pediatrician regarding the use of this medicine in children. Special care may be needed. Overdosage: If you think  you have taken too much of this medicine contact a poison control center or emergency room at once. NOTE: This medicine is only for you. Do not share this medicine with others. What if I miss a dose? If you miss a dose, take it as soon as you can. If it is almost time for your next dose, take only that dose. Do not take double or extra doses. What may interact with this medicine?  codeine  desipramine  digoxin  flecainide  MAOIs like Carbex, Eldepryl, Marplan, Nardil, and Parnate  methadone  metoprolol  pimozide  propafenone  thioridazine  warfarin This list may not describe all possible interactions. Give your health care provider a list of all the medicines, herbs, non-prescription drugs, or dietary supplements you use. Also tell them if you smoke, drink alcohol, or use illegal drugs. Some items may interact with your medicine. What should I watch for while using this medicine? Visit your doctor or health care professional for regular checks on your progress. Check your blood pressure as directed. Ask your doctor or health care professional what your blood pressure should be and when you should contact him or her. You may need to limit your intake of tea, coffee, caffeinated sodas, or alcohol. These drinks may make your symptoms worse. What side effects may I notice from receiving this medicine? Side effects that you should report to your doctor or health care professional as soon as possible:  allergic reactions like skin rash, itching or hives, swelling of the face, lips, or tongue  high blood pressure  fast, irregular heartbeat  redness, blistering, peeling or loosening of the skin, including inside the mouth  signs of infection like fever or chills; pain or difficulty passing urine  trouble passing urine or change in the amount of urine Side effects that usually do not require medical attention (report to your doctor or health care professional if they continue or  are bothersome):  constipation  dry mouth  headache  runny nose  stomach upset This list may not describe all possible side effects. Call your doctor for medical advice about side effects. You may report side effects to FDA at 1-800-FDA-1088. Where should I keep my medicine? Keep out of the reach of children. Store at room temperature between 15 and 30 degrees C (59 and 86 degrees F). Throw away any unused medicine after the expiration date. NOTE: This sheet is a summary. It may not cover all possible information. If you have questions about this medicine, talk to your doctor, pharmacist, or health care provider.  2020 Elsevier/Gold Standard (2016-06-29 11:33:21)  

## 2018-12-23 ENCOUNTER — Ambulatory Visit: Payer: Medicare HMO | Admitting: Obstetrics & Gynecology

## 2019-02-27 ENCOUNTER — Other Ambulatory Visit: Payer: Self-pay | Admitting: Endocrinology

## 2019-02-27 DIAGNOSIS — Z1231 Encounter for screening mammogram for malignant neoplasm of breast: Secondary | ICD-10-CM

## 2019-03-24 ENCOUNTER — Ambulatory Visit
Admission: RE | Admit: 2019-03-24 | Discharge: 2019-03-24 | Disposition: A | Discharge status: Home / Self Care | Payer: Medicare HMO | Source: Ambulatory Visit | Attending: Endocrinology | Admitting: Endocrinology

## 2019-03-24 DIAGNOSIS — Z1231 Encounter for screening mammogram for malignant neoplasm of breast: Secondary | ICD-10-CM | POA: Insufficient documentation

## 2020-05-12 ENCOUNTER — Other Ambulatory Visit: Payer: Self-pay | Admitting: Endocrinology

## 2020-05-12 DIAGNOSIS — Z1231 Encounter for screening mammogram for malignant neoplasm of breast: Secondary | ICD-10-CM

## 2020-06-08 ENCOUNTER — Ambulatory Visit
Admission: RE | Admit: 2020-06-08 | Discharge: 2020-06-08 | Disposition: A | Discharge status: Home / Self Care | Payer: Medicare HMO | Source: Ambulatory Visit | Attending: Endocrinology | Admitting: Endocrinology

## 2020-06-08 ENCOUNTER — Other Ambulatory Visit: Payer: Self-pay

## 2020-06-08 DIAGNOSIS — Z1231 Encounter for screening mammogram for malignant neoplasm of breast: Secondary | ICD-10-CM | POA: Insufficient documentation

## 2021-05-23 ENCOUNTER — Other Ambulatory Visit: Payer: Self-pay | Admitting: Endocrinology

## 2021-05-23 DIAGNOSIS — Z1231 Encounter for screening mammogram for malignant neoplasm of breast: Secondary | ICD-10-CM

## 2021-08-25 ENCOUNTER — Ambulatory Visit
Admission: RE | Admit: 2021-08-25 | Discharge: 2021-08-25 | Disposition: A | Discharge status: Home / Self Care | Payer: Medicare HMO | Source: Ambulatory Visit | Attending: Endocrinology | Admitting: Endocrinology

## 2021-08-25 DIAGNOSIS — Z1231 Encounter for screening mammogram for malignant neoplasm of breast: Secondary | ICD-10-CM | POA: Diagnosis present

## 2021-08-31 ENCOUNTER — Other Ambulatory Visit: Payer: Self-pay | Admitting: Physician Assistant

## 2021-08-31 DIAGNOSIS — M7541 Impingement syndrome of right shoulder: Secondary | ICD-10-CM

## 2021-09-07 ENCOUNTER — Ambulatory Visit
Admission: RE | Admit: 2021-09-07 | Discharge: 2021-09-07 | Disposition: A | Discharge status: Home / Self Care | Payer: Medicare HMO | Source: Ambulatory Visit | Attending: Physician Assistant | Admitting: Physician Assistant

## 2021-09-07 DIAGNOSIS — M7541 Impingement syndrome of right shoulder: Secondary | ICD-10-CM | POA: Insufficient documentation

## 2021-09-21 IMAGING — MG MM DIGITAL SCREENING BILAT W/ TOMO AND CAD
8 series · 8 of 24 positions shown · non-contrast
Comparison: Previous exam(s).

CLINICAL DATA: Screening.

EXAM:
DIGITAL SCREENING BILATERAL MAMMOGRAM WITH TOMOSYNTHESIS AND CAD
TECHNIQUE: Bilateral screening digital craniocaudal and mediolateral oblique
mammograms were obtained. Bilateral screening digital breast
tomosynthesis was performed. The images were evaluated with
computer-aided detection.

[R CC synth-2D]
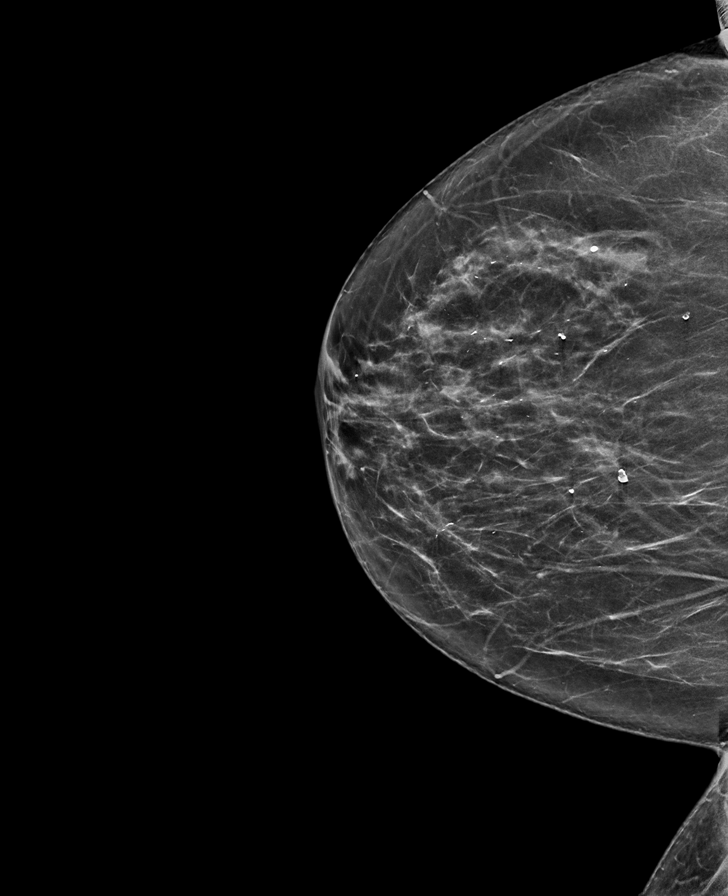

[L MLO synth-2D]
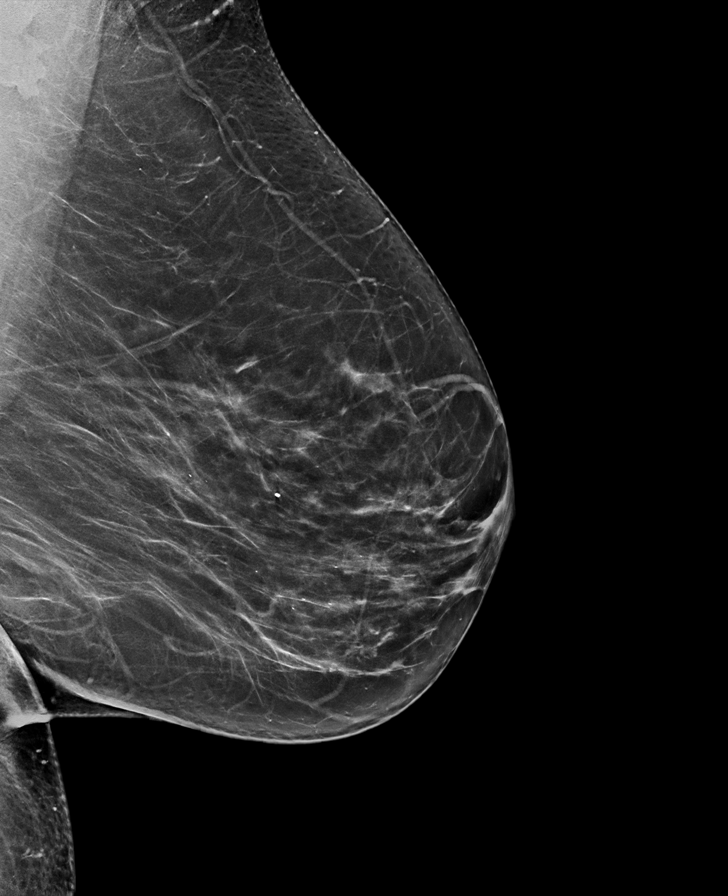

[R MLO synth-2D]
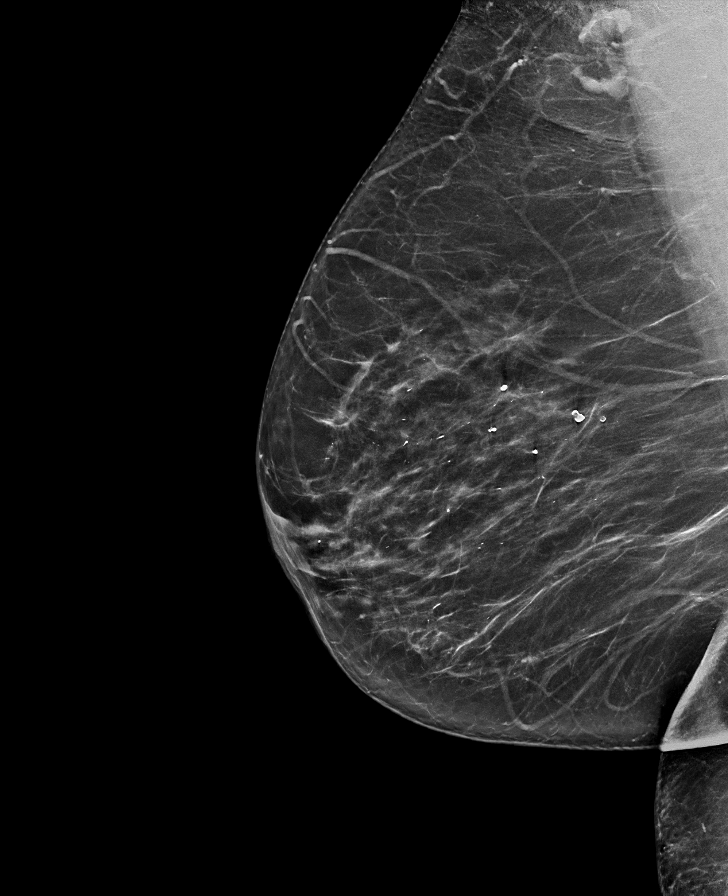

[L CC synth-2D]
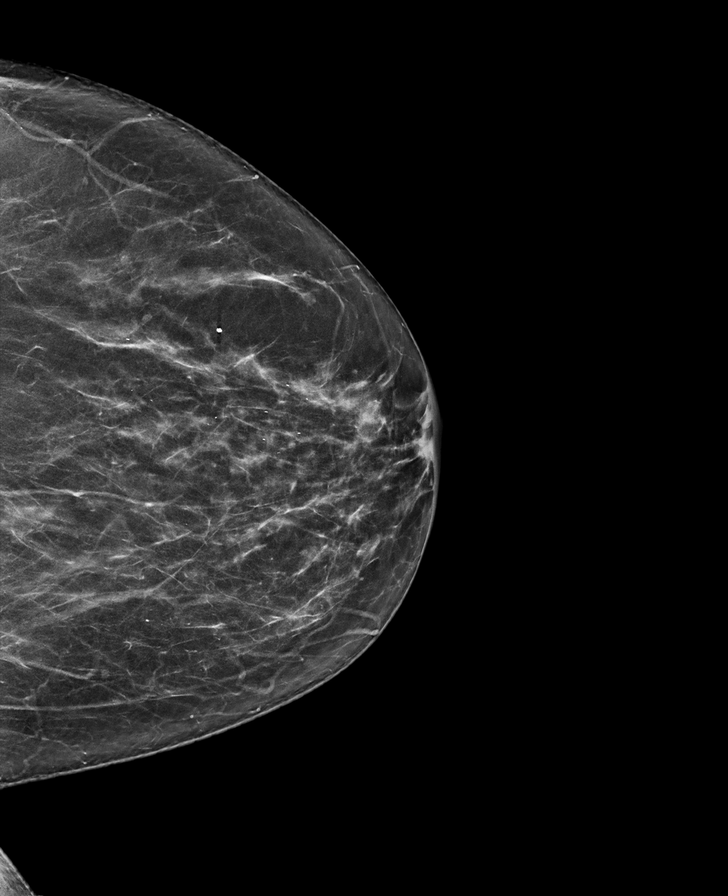

[R MLO tomo · tomo slice 38/75.0]
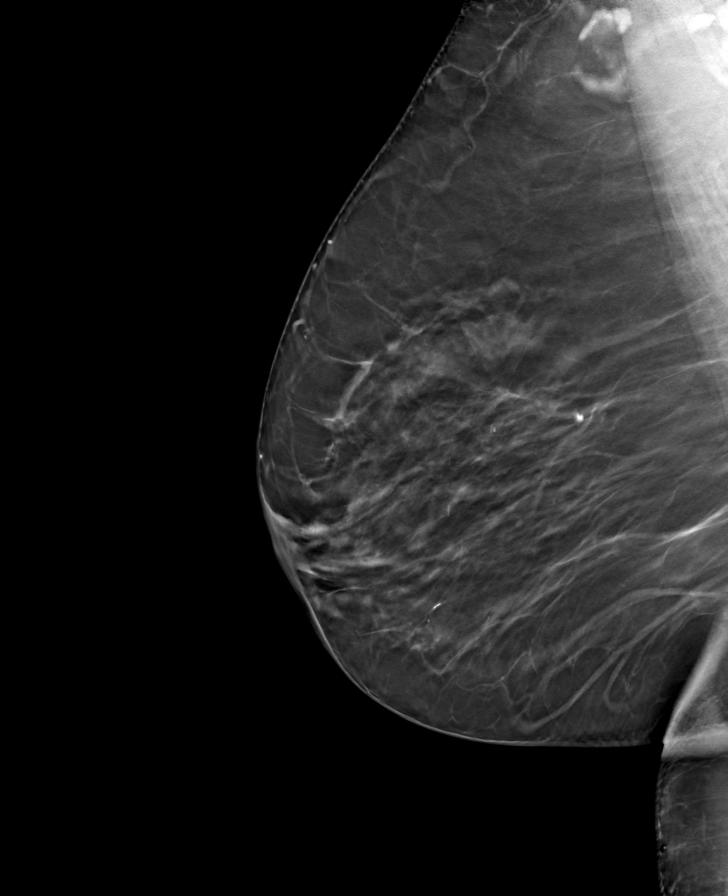

[R CC tomo · tomo slice 37/72.0]
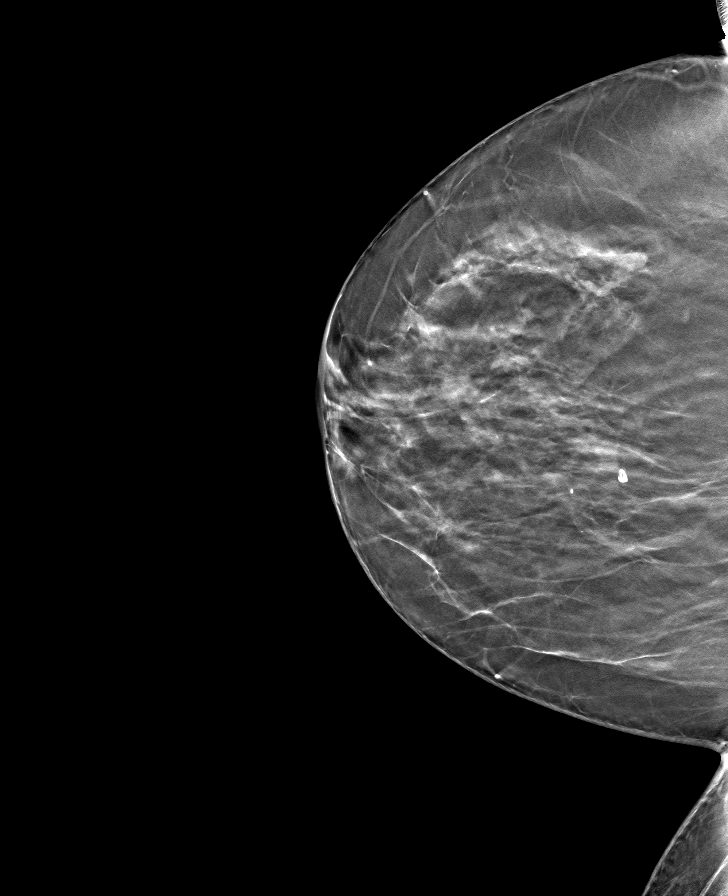

[L MLO tomo · tomo slice 39/77.0]
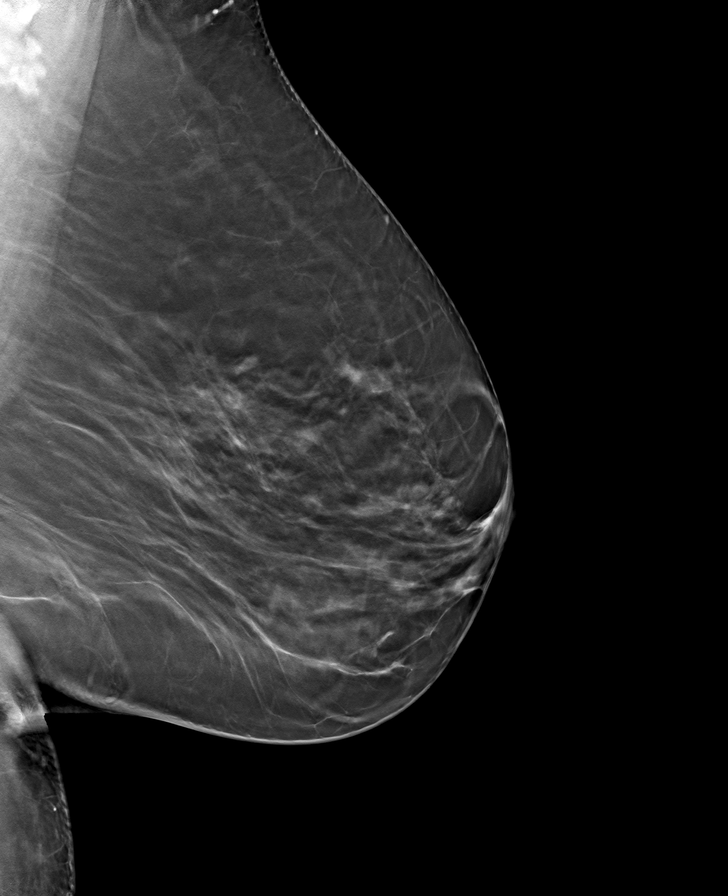

[L CC tomo · tomo slice 35/68.0]
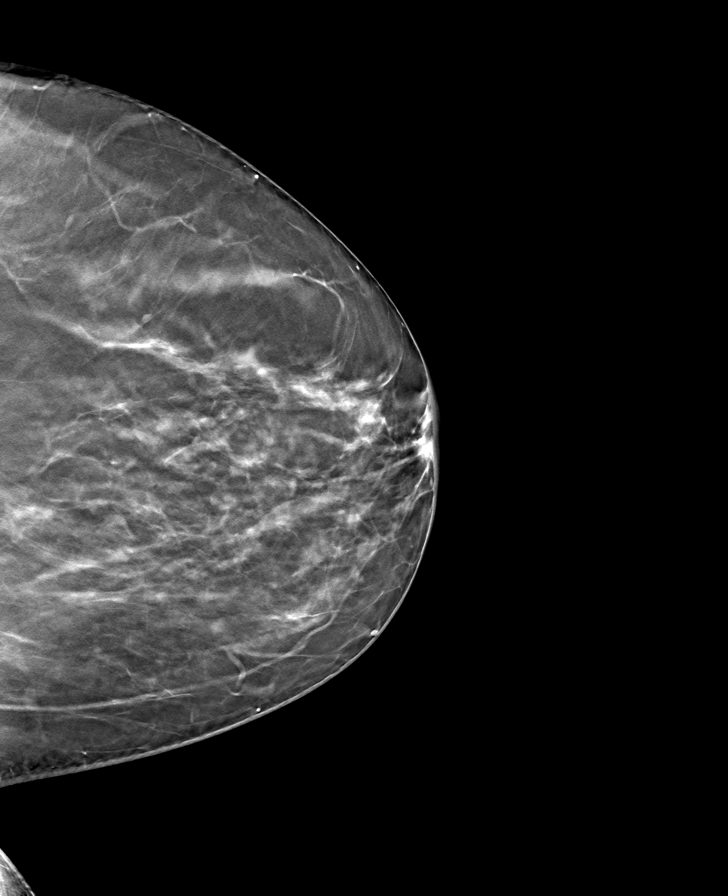

[8 of 24 positions shown; findings below may reference images not displayed]

ACR Breast Density Category b: There are scattered areas of
fibroglandular density.
FINDINGS: There are no findings suspicious for malignancy. The images were
evaluated with computer-aided detection.
IMPRESSION: No mammographic evidence of malignancy. A result letter of this
screening mammogram will be mailed directly to the patient.

RECOMMENDATION:
Screening mammogram in one year. (Code:[id number])

BI-RADS CATEGORY  1: Negative.

## 2021-10-25 ENCOUNTER — Other Ambulatory Visit: Payer: Self-pay | Admitting: Surgery

## 2021-11-01 ENCOUNTER — Other Ambulatory Visit: Payer: Self-pay

## 2021-11-01 ENCOUNTER — Encounter
Admission: RE | Admit: 2021-11-01 | Discharge: 2021-11-01 | Disposition: A | Discharge status: Home / Self Care | Payer: Medicare HMO | Source: Ambulatory Visit | Attending: Surgery | Admitting: Surgery

## 2021-11-01 ENCOUNTER — Encounter (HOSPITAL_COMMUNITY): Payer: Self-pay | Admitting: Urgent Care

## 2021-11-01 VITALS — BP 166/78 | HR 66 | Resp 16 | Ht 65.0 in | Wt 213.2 lb

## 2021-11-01 DIAGNOSIS — Z01818 Encounter for other preprocedural examination: Secondary | ICD-10-CM | POA: Insufficient documentation

## 2021-11-01 DIAGNOSIS — Z01812 Encounter for preprocedural laboratory examination: Secondary | ICD-10-CM

## 2021-11-01 LAB — COMPREHENSIVE METABOLIC PANEL
ALT: 22 U/L (ref 0–44)
AST: 26 U/L (ref 15–41)
Albumin: 4.3 g/dL (ref 3.5–5.0)
Alkaline Phosphatase: 59 U/L (ref 38–126)
Anion gap: 8 (ref 5–15)
BUN: 17 mg/dL (ref 8–23)
CO2: 25 mmol/L (ref 22–32)
Calcium: 9.5 mg/dL (ref 8.9–10.3)
Chloride: 106 mmol/L (ref 98–111)
Creatinine, Ser: 0.98 mg/dL (ref 0.44–1.00)
GFR, Estimated: 60 mL/min — ABNORMAL LOW (ref >60)
Glucose, Bld: 130 mg/dL — ABNORMAL HIGH (ref 70–99)
Potassium: 3.6 mmol/L (ref 3.5–5.1)
Sodium: 139 mmol/L (ref 135–145)
Total Bilirubin: 0.7 mg/dL (ref 0.3–1.2)
Total Protein: 8 g/dL (ref 6.5–8.1)

## 2021-11-01 LAB — URINALYSIS, ROUTINE W REFLEX MICROSCOPIC
Bacteria, UA: NONE SEEN
Bilirubin Urine: NEGATIVE
Glucose, UA: NEGATIVE mg/dL
Hgb urine dipstick: NEGATIVE
Ketones, ur: NEGATIVE mg/dL
Nitrite: NEGATIVE
Protein, ur: NEGATIVE mg/dL
Specific Gravity, Urine: 1.017 (ref 1.005–1.030)
pH: 6 (ref 5.0–8.0)

## 2021-11-01 LAB — CBC WITH DIFFERENTIAL/PLATELET
Abs Immature Granulocytes: 0.01 10*3/uL (ref 0.00–0.07)
Basophils Absolute: 0.1 10*3/uL (ref 0.0–0.1)
Basophils Relative: 1 %
Eosinophils Absolute: 0.4 10*3/uL (ref 0.0–0.5)
Eosinophils Relative: 7 %
HCT: 41 % (ref 36.0–46.0)
Hemoglobin: 13.6 g/dL (ref 12.0–15.0)
Immature Granulocytes: 0 %
Lymphocytes Relative: 29 %
Lymphs Abs: 1.5 10*3/uL (ref 0.7–4.0)
MCH: 28.6 pg (ref 26.0–34.0)
MCHC: 33.2 g/dL (ref 30.0–36.0)
MCV: 86.3 fL (ref 80.0–100.0)
Monocytes Absolute: 0.6 10*3/uL (ref 0.1–1.0)
Monocytes Relative: 11 %
Neutro Abs: 2.7 10*3/uL (ref 1.7–7.7)
Neutrophils Relative %: 52 %
Platelets: 269 10*3/uL (ref 150–400)
RBC: 4.75 MIL/uL (ref 3.87–5.11)
RDW: 12.1 % (ref 11.5–15.5)
WBC: 5.2 10*3/uL (ref 4.0–10.5)
nRBC: 0 % (ref 0.0–0.2)

## 2021-11-01 LAB — TYPE AND SCREEN
ABO/RH(D): B POS
Antibody Screen: NEGATIVE

## 2021-11-01 LAB — SURGICAL PCR SCREEN
MRSA, PCR: NEGATIVE
Staphylococcus aureus: POSITIVE — AB

## 2021-11-01 NOTE — Patient Instructions (Addendum)
Your procedure is scheduled on: 11/08/21 - Tuesday Report to the Registration Desk on the 1st floor of the [facility]. To find out your arrival time, please call [phone] between 1PM - 3PM on: 11/07/21 - Monday If your arrival time is 6:00 am, do not arrive prior to that time as the [facility] entrance doors do not open until 6:00 am.  REMEMBER: Instructions that are not followed completely may result in serious medical risk, up to and including death; or upon the discretion of your surgeon and anesthesiologist your surgery may need to be rescheduled.  Do not eat food after midnight the night before surgery.  No gum chewing, lozengers or hard candies.  You may however, drink CLEAR liquids up to 2 hours before you are scheduled to arrive for your surgery. Do not drink anything within 2 hours of your scheduled arrival time.  Type 1 and Type 2 diabetics should only drink water.  In addition, your doctor has ordered for you to drink the provided  Gatorade G2 Drinking this carbohydrate drink up to two hours before surgery helps to reduce insulin resistance and improve patient outcomes. Please complete drinking 2 hours prior to scheduled arrival time.  TAKE ONLY THESE MEDICATIONS THE MORNING OF SURGERY WITH A SIP OF WATER:  - Omeprazole- (take one the night before and one on the morning of surgery - helps to prevent nausea after surgery.)  Hold Metformin HCl beginning 11/06/21, may resume taking the day after your surgery.  Follow recommendations from Cardiologist, Pulmonologist or PCP regarding stopping Aspirin, Coumadin, Plavix, Eliquis, Pradaxa, or Pletal. Do not take aspirin 81 mg on the day of surgery.  One week prior to surgery: Stop Anti-inflammatories (NSAIDS) such as Advil, Aleve, Ibuprofen, Motrin, Naproxen, Naprosyn and Aspirin based products such as Excedrin, Goodys Powder, BC Powder.  Stop ANY OVER THE COUNTER supplements until after surgery. - Biotin , calcium  carbonate (OS-CAL) , MULTIPLE VITAMIN , folic acid (FOLVITE), Super Beets.  You may take Tylenol if needed for pain up until the day of surgery.  No Alcohol for 24 hours before or after surgery.  No Smoking including e-cigarettes for 24 hours prior to surgery.  No chewable tobacco products for at least 6 hours prior to surgery.  No nicotine patches on the day of surgery.  Do not use any "recreational" drugs for at least a week prior to your surgery.  Please be advised that the combination of cocaine and anesthesia may have negative outcomes, up to and including death. If you test positive for cocaine, your surgery will be cancelled.  On the morning of surgery brush your teeth with toothpaste and water, you may rinse your mouth with mouthwash if you wish. Do not swallow any toothpaste or mouthwash.  Use CHG Soap or wipes as directed on instruction sheet.  Do not wear jewelry, make-up, hairpins, clips or nail polish.  Do not wear lotions, powders, or perfumes.   Do not shave body from the neck down 48 hours prior to surgery just in case you cut yourself which could leave a site for infection.  Also, freshly shaved skin may become irritated if using the CHG soap.  Contact lenses, hearing aids and dentures may not be worn into surgery.  Do not bring valuables to the hospital. [facility] is not responsible for any missing/lost belongings or valuables.   Total Shoulder Arthroplasty:  use Benzolyl Peroxide 5% Gel as directed on instruction sheet.  Notify your doctor if there is  any change in your medical condition (cold, fever, infection).  Wear comfortable clothing (specific to your surgery type) to the hospital.  After surgery, you can help prevent lung complications by doing breathing exercises.  Take deep breaths and cough every 1-2 hours. Your doctor may order a device called an Incentive Spirometer to help you take deep breaths. When coughing or sneezing, hold a pillow firmly  against your incision with both hands. This is called "splinting." Doing this helps protect your incision. It also decreases belly discomfort.  If you are being admitted to the hospital overnight, leave your suitcase in the car. After surgery it may be brought to your room.  If you are being discharged the day of surgery, you will not be allowed to drive home. You will need a responsible adult (18 years or older) to drive you home and stay with you that night.   If you are taking public transportation, you will need to have a responsible adult (18 years or older) with you. Please confirm with your physician that it is acceptable to use public transportation.   Please call the [facility] at [phone] if you have any questions about these instructions.  Surgery Visitation Policy:  Patients undergoing a surgery or procedure may have two family members or support persons with them as long as the person is not COVID-19 positive or experiencing its symptoms.   Inpatient Visitation:    Visiting hours are 7 a.m. to 8 p.m. Up to four visitors are allowed at one time in a patient room, including children. The visitors may rotate out with other people during the day. One designated support person (adult) may remain overnight.

## 2021-11-08 ENCOUNTER — Encounter: Admission: RE | Payer: Self-pay | Source: Ambulatory Visit

## 2021-11-08 ENCOUNTER — Ambulatory Visit: Admission: RE | Admit: 2021-11-08 | Payer: Medicare HMO | Source: Ambulatory Visit | Admitting: Surgery

## 2021-11-08 SURGERY — ARTHROPLASTY, SHOULDER, TOTAL, REVERSE
Anesthesia: Choice | Site: Shoulder | Laterality: Right

## 2021-11-17 ENCOUNTER — Emergency Department: Payer: Medicare HMO

## 2021-11-17 ENCOUNTER — Emergency Department
Admission: EM | Admit: 2021-11-17 | Discharge: 2021-11-17 | Disposition: A | Discharge status: Home / Self Care | Payer: Medicare HMO | Attending: Emergency Medicine | Admitting: Emergency Medicine

## 2021-11-17 DIAGNOSIS — M5416 Radiculopathy, lumbar region: Secondary | ICD-10-CM | POA: Diagnosis not present

## 2021-11-17 DIAGNOSIS — M545 Low back pain, unspecified: Secondary | ICD-10-CM | POA: Diagnosis present

## 2021-11-17 DIAGNOSIS — M25551 Pain in right hip: Secondary | ICD-10-CM | POA: Insufficient documentation

## 2021-11-17 MED ORDER — LIDOCAINE 5 % EX PTCH: 1.0000 | MEDICATED_PATCH | Freq: Two times a day (BID) | CUTANEOUS | 0 refills | Status: DC

## 2021-11-17 MED ORDER — METHYLPREDNISOLONE 4 MG PO TBPK: ORAL_TABLET | ORAL | 0 refills | Status: DC

## 2021-11-17 MED ORDER — LIDOCAINE 5 % EX PTCH
1.0000 | MEDICATED_PATCH | CUTANEOUS | Status: DC
  Administered 2021-11-17: 1 via TRANSDERMAL
  Filled 2021-11-17: qty 1

## 2021-11-17 MED ORDER — BACLOFEN 10 MG PO TABS: 10.0000 mg | ORAL_TABLET | Freq: Three times a day (TID) | ORAL | 0 refills | Status: AC

## 2021-11-17 NOTE — ED Triage Notes (Signed)
Pain from right hip travels thru right leg, no fall, no injury, no other complaints

## 2021-11-17 NOTE — ED Provider Triage Note (Signed)
Emergency Medicine Provider Triage Evaluation Note  GENEE RANN , a 76 y.o. female  was evaluated in triage.  Pt complains of lower back and right hip pain that radiates to the leg.  Worse with movement.  Worse after sitting for a while.  No known injury.  Review of Systems  Positive: Back pain, hip pain Negative: Injury  Physical Exam  BP (!) 189/77 (BP Location: Right Arm)   Pulse 69   Temp 98.7 F (37.1 C) (Oral)   Resp 17   Ht '5\' 5"'$  (1.651 m)   Wt 95 kg   SpO2 98%   BMI 34.85 kg/m  Gen:   Awake, no distress   Resp:  Normal effort  MSK:   Moves extremities without difficulty  Other:    Medical Decision Making  Medically screening exam initiated at 12:56 PM.  Appropriate orders placed.  MILLIANNA SZYMBORSKI was informed that the remainder of the evaluation will be completed by another provider, this initial triage assessment does not replace that evaluation, and the importance of remaining in the ED until their evaluation is complete.  X-rays ordered   Versie Starks, PA-C 11/17/21 1256

## 2021-11-17 NOTE — ED Provider Notes (Signed)
[facility] Provider Note    Event Date/Time   First MD Initiated Contact with Patient 11/17/21 1427     (approximate)   History   Hip Pain (Pain from right hip travels thru right leg, no fall, no injury, no other complaints )   HPI  Becky Gordon is a 76 y.o. female presents emergency department complaining of right hip and lower back pain that radiates into her thigh.  Symptoms been ongoing for several days.  No fall or injury.  No UTI symptoms.  No fever or chills      Physical Exam   Triage Vital Signs: ED Triage Vitals  Enc Vitals Group     BP 11/17/21 1251 (!) 189/77     Pulse Rate 11/17/21 1251 69     Resp 11/17/21 1251 17     Temp 11/17/21 1251 98.7 F (37.1 C)     Temp Source 11/17/21 1251 Oral     SpO2 11/17/21 1251 98 %     Weight 11/17/21 1252 209 lb 7 oz (95 kg)     Height 11/17/21 1252 '5\' 5"'$  (1.651 m)     Head Circumference --      Peak Flow --      Pain Score 11/17/21 1252 5     Pain Loc --      Pain Edu? --      Excl. in [place]? --     Most recent vital signs: Vitals:   11/17/21 1251  BP: (!) 189/77  Pulse: 69  Resp: 17  Temp: 98.7 F (37.1 C)  SpO2: 98%     General: Awake, no distress.   CV:  Good peripheral perfusion. regular rate and  rhythm Resp:  Normal effort.  Abd:  No distention.   Other:  Lumbar spine and right hip tender to palpation, patient is able to ambulate without difficulty, no foot drop noted, neurovascular is intact, 5 or 5 strength in lower extremities   ED Results / Procedures / Treatments   Labs (all labs ordered are listed, but only abnormal results are displayed) Labs Reviewed - No data to display   EKG      RADIOLOGY  X-ray of the right hip and lumbar spine    PROCEDURES:   Procedures   MEDICATIONS ORDERED IN ED: Medications  lidocaine (LIDODERM) 5 % 1 patch (has no administration in time range)     IMPRESSION / MDM / ASSESSMENT AND PLAN / ED COURSE  I  reviewed the triage vital signs and the nursing notes.                              Differential diagnosis includes, but is not limited to,  Stress fracture, bulging disc, sciatica, strain  Patient's presentation is most consistent with acute complicated illness / injury requiring diagnostic workup.   X-ray of the lumbar spine and right hip individually reviewed and interpreted by me as being negative.  I did explain these findings to the patient.  We will place her on a Medrol Dosepak and baclofen.  She is also given a Lidoderm patch.  Prescription for Lidoderm also sent to the pharmacy.  She is to follow-up with orthopedics.  She states [facility] and would like to follow-up with them.  She is to apply ice to the area.  She was discharged stable condition.      FINAL CLINICAL IMPRESSION(S) / ED DIAGNOSES  Final diagnoses:  Lumbar radiculopathy, acute     Rx / DC Orders   ED Discharge Orders          Ordered    methylPREDNISolone (MEDROL DOSEPAK) 4 MG TBPK tablet        11/17/21 1439    baclofen (LIORESAL) 10 MG tablet  3 times daily        11/17/21 1439    lidocaine (LIDODERM) 5 %  Every 12 hours        11/17/21 1439             Note:  This document was prepared using Dragon voice recognition software and may include unintentional dictation errors.    Versie Starks, PA-C 11/17/21 1724    Carrie Mew, MD 11/18/21 [place]

## 2021-11-17 NOTE — Discharge Instructions (Signed)
Follow-up with [facility] orthopedics. Take medication as prescribed. Return if you are worsening

## 2021-12-28 ENCOUNTER — Other Ambulatory Visit: Payer: Self-pay | Admitting: Physician Assistant

## 2021-12-28 DIAGNOSIS — M5416 Radiculopathy, lumbar region: Secondary | ICD-10-CM

## 2022-01-02 ENCOUNTER — Ambulatory Visit
Admission: RE | Admit: 2022-01-02 | Discharge: 2022-01-02 | Disposition: A | Discharge status: Home / Self Care | Payer: Medicare HMO | Source: Ambulatory Visit | Attending: Physician Assistant | Admitting: Physician Assistant

## 2022-01-02 DIAGNOSIS — M5416 Radiculopathy, lumbar region: Secondary | ICD-10-CM

## 2022-01-09 ENCOUNTER — Other Ambulatory Visit: Payer: Self-pay | Admitting: Surgery

## 2022-01-19 ENCOUNTER — Encounter
Admission: RE | Admit: 2022-01-19 | Discharge: 2022-01-19 | Disposition: A | Discharge status: Home / Self Care | Payer: Medicare HMO | Source: Ambulatory Visit | Attending: Surgery | Admitting: Surgery

## 2022-01-19 ENCOUNTER — Other Ambulatory Visit: Payer: Self-pay

## 2022-01-19 VITALS — BP 137/71 | HR 70 | Resp 16 | Ht 65.0 in | Wt 200.8 lb

## 2022-01-19 DIAGNOSIS — Z79899 Other long term (current) drug therapy: Secondary | ICD-10-CM | POA: Diagnosis not present

## 2022-01-19 DIAGNOSIS — Z01812 Encounter for preprocedural laboratory examination: Secondary | ICD-10-CM | POA: Diagnosis not present

## 2022-01-19 LAB — URINALYSIS, ROUTINE W REFLEX MICROSCOPIC
Bilirubin Urine: NEGATIVE
Glucose, UA: NEGATIVE mg/dL
Hgb urine dipstick: NEGATIVE
Ketones, ur: NEGATIVE mg/dL
Leukocytes,Ua: NEGATIVE
Nitrite: NEGATIVE
Protein, ur: NEGATIVE mg/dL
Specific Gravity, Urine: 1.017 (ref 1.005–1.030)
pH: 6 (ref 5.0–8.0)

## 2022-01-19 LAB — COMPREHENSIVE METABOLIC PANEL
ALT: 22 U/L (ref 0–44)
AST: 24 U/L (ref 15–41)
Albumin: 4.1 g/dL (ref 3.5–5.0)
Alkaline Phosphatase: 60 U/L (ref 38–126)
Anion gap: 9 (ref 5–15)
BUN: 17 mg/dL (ref 8–23)
CO2: 28 mmol/L (ref 22–32)
Calcium: 9.9 mg/dL (ref 8.9–10.3)
Chloride: 104 mmol/L (ref 98–111)
Creatinine, Ser: 0.89 mg/dL (ref 0.44–1.00)
GFR, Estimated: >60 mL/min (ref >60)
Glucose, Bld: 129 mg/dL — ABNORMAL HIGH (ref 70–99)
Potassium: 4.4 mmol/L (ref 3.5–5.1)
Sodium: 141 mmol/L (ref 135–145)
Total Bilirubin: 0.7 mg/dL (ref 0.3–1.2)
Total Protein: 8 g/dL (ref 6.5–8.1)

## 2022-01-19 LAB — CBC WITH DIFFERENTIAL/PLATELET
Abs Immature Granulocytes: 0.02 10*3/uL (ref 0.00–0.07)
Basophils Absolute: 0.1 10*3/uL (ref 0.0–0.1)
Basophils Relative: 1 %
Eosinophils Absolute: 0.3 10*3/uL (ref 0.0–0.5)
Eosinophils Relative: 5 %
HCT: 41 % (ref 36.0–46.0)
Hemoglobin: 13.4 g/dL (ref 12.0–15.0)
Immature Granulocytes: 0 %
Lymphocytes Relative: 33 %
Lymphs Abs: 2.1 10*3/uL (ref 0.7–4.0)
MCH: 28.6 pg (ref 26.0–34.0)
MCHC: 32.7 g/dL (ref 30.0–36.0)
MCV: 87.6 fL (ref 80.0–100.0)
Monocytes Absolute: 0.6 10*3/uL (ref 0.1–1.0)
Monocytes Relative: 9 %
Neutro Abs: 3.2 10*3/uL (ref 1.7–7.7)
Neutrophils Relative %: 52 %
Platelets: 302 10*3/uL (ref 150–400)
RBC: 4.68 MIL/uL (ref 3.87–5.11)
RDW: 12.9 % (ref 11.5–15.5)
WBC: 6.3 10*3/uL (ref 4.0–10.5)
nRBC: 0 % (ref 0.0–0.2)

## 2022-01-19 LAB — TYPE AND SCREEN
ABO/RH(D): B POS
Antibody Screen: NEGATIVE

## 2022-01-19 LAB — SURGICAL PCR SCREEN
MRSA, PCR: NEGATIVE
Staphylococcus aureus: POSITIVE — AB

## 2022-01-19 NOTE — Patient Instructions (Addendum)
Your procedure is scheduled on: 01/26/22 - Thursday Report to the Registration Desk on the 1st floor of the [facility]. To find out your arrival time, please call [phone] between 1PM - 3PM on: 01/25/22 - Wednesday If your arrival time is 6:00 am, do not arrive prior to that time as the [facility] entrance doors do not open until 6:00 am.  REMEMBER: Instructions that are not followed completely may result in serious medical risk, up to and including death; or upon the discretion of your surgeon and anesthesiologist your surgery may need to be rescheduled.  Do not eat food after midnight the night before surgery.  No gum chewing, lozengers or hard candies.  You may however, drink CLEAR liquids up to 2 hours before you are scheduled to arrive for your surgery. Do not drink anything within 2 hours of your scheduled arrival time.  Type 1 and Type 2 diabetics should only drink water.  In addition, your doctor has ordered for you to drink the provided  Gatorade G2 Drinking this carbohydrate drink up to two hours before surgery helps to reduce insulin resistance and improve patient outcomes. Please complete drinking 2 hours prior to scheduled arrival time.  TAKE THESE MEDICATIONS THE MORNING OF SURGERY WITH A SIP OF WATER: - Omeprazole   HOLD metFORMIN (GLUCOPHAGE) beginning 01/24/22.  One week prior to surgery: Do Not take aspirin on the day of surgery. Stop Anti-inflammatories (NSAIDS) such as Advil, Aleve, Ibuprofen, Motrin, Naproxen, Naprosyn and Aspirin based products such as Excedrin, Goodys Powder, BC Powder.  Stop ANY OVER THE COUNTER supplements until after surgery.  You may however, continue to take Tylenol if needed for pain up until the day of surgery.  No Alcohol for 24 hours before or after surgery.  No Smoking including e-cigarettes for 24 hours prior to surgery.  No chewable tobacco products for at least 6 hours prior to surgery.  No nicotine patches on the day  of surgery.  Do not use any "recreational" drugs for at least a week prior to your surgery.  Please be advised that the combination of cocaine and anesthesia may have negative outcomes, up to and including death. If you test positive for cocaine, your surgery will be cancelled.  On the morning of surgery brush your teeth with toothpaste and water, you may rinse your mouth with mouthwash if you wish. Do not swallow any toothpaste or mouthwash.  Use CHG Soap or wipes as directed on instruction sheet.  Do not wear jewelry, make-up, hairpins, clips or nail polish.  Do not wear lotions, powders, or perfumes.   Do not shave body from the neck down 48 hours prior to surgery just in case you cut yourself which could leave a site for infection.  Also, freshly shaved skin may become irritated if using the CHG soap.  Contact lenses, hearing aids and dentures may not be worn into surgery.  Do not bring valuables to the hospital. [facility] is not responsible for any missing/lost belongings or valuables.   Total Shoulder Arthroplasty:  use Benzolyl Peroxide 5% Gel as directed on instruction sheet.  Notify your doctor if there is any change in your medical condition (cold, fever, infection).  Wear comfortable clothing (specific to your surgery type) to the hospital.  After surgery, you can help prevent lung complications by doing breathing exercises.  Take deep breaths and cough every 1-2 hours. Your doctor may order a device called an Incentive Spirometer to help you take deep breaths.  When coughing or sneezing, hold a pillow firmly against your incision with both hands. This is called "splinting." Doing this helps protect your incision. It also decreases belly discomfort.  If you are being admitted to the hospital overnight, leave your suitcase in the car. After surgery it may be brought to your room.  If you are being discharged the day of surgery, you will not be allowed to drive home. You  will need a responsible adult (18 years or older) to drive you home and stay with you that night.   If you are taking public transportation, you will need to have a responsible adult (18 years or older) with you. Please confirm with your physician that it is acceptable to use public transportation.   Please call the [facility] Dept. at [phone] if you have any questions about these instructions.  Surgery Visitation Policy:  Patients undergoing a surgery or procedure may have two family members or support persons with them as long as the person is not COVID-19 positive or experiencing its symptoms.   Inpatient Visitation:    Visiting hours are 7 a.m. to 8 p.m. Up to four visitors are allowed at one time in a patient room. The visitors may rotate out with other people during the day. One designated support person (adult) may remain overnight.  MASKING: Due to an increase in RSV rates and hospitalizations, in-patient care areas in which we serve newborns, infants and children, masks will be required for teammates and visitors.  Children ages 61 and under may not visit. This policy affects the following departments only:  [facility] [facility] area Mother Baby Unit Newborn nursery/Special care nursery  Other areas: Masks continue to be strongly recommended for [place] teammates, visitors and patients in all other areas. Visitation is not restricted outside of the units listed above.

## 2022-01-25 MED ORDER — CHLORHEXIDINE GLUCONATE 0.12 % MT SOLN: 15.0000 mL | Freq: Once | OROMUCOSAL | Status: AC

## 2022-01-25 MED ORDER — ORAL CARE MOUTH RINSE: 15.0000 mL | Freq: Once | OROMUCOSAL | Status: AC

## 2022-01-25 MED ORDER — SODIUM CHLORIDE 0.9 % IV SOLN: INTRAVENOUS | Status: DC

## 2022-01-25 MED ORDER — CEFAZOLIN SODIUM-DEXTROSE 2-4 GM/100ML-% IV SOLN
2.0000 g | INTRAVENOUS | Status: AC
  Administered 2022-01-26: 2 g via INTRAVENOUS

## 2022-01-26 ENCOUNTER — Ambulatory Visit: Payer: Medicare HMO

## 2022-01-26 ENCOUNTER — Ambulatory Visit: Payer: Medicare HMO | Admitting: Anesthesiology

## 2022-01-26 ENCOUNTER — Ambulatory Visit: Payer: Medicare HMO | Admitting: Urgent Care

## 2022-01-26 ENCOUNTER — Other Ambulatory Visit: Payer: Self-pay

## 2022-01-26 ENCOUNTER — Encounter
Admission: RE | Disposition: A | Discharge status: Home / Self Care | Payer: Self-pay | Source: Ambulatory Visit | Attending: Surgery

## 2022-01-26 ENCOUNTER — Encounter: Payer: Self-pay | Admitting: Surgery

## 2022-01-26 ENCOUNTER — Ambulatory Visit
Admission: RE | Admit: 2022-01-26 | Discharge: 2022-01-26 | Disposition: A | Discharge status: Home / Self Care | Payer: Medicare HMO | Source: Ambulatory Visit | Attending: Surgery | Admitting: Surgery

## 2022-01-26 DIAGNOSIS — Z7984 Long term (current) use of oral hypoglycemic drugs: Secondary | ICD-10-CM | POA: Insufficient documentation

## 2022-01-26 DIAGNOSIS — K219 Gastro-esophageal reflux disease without esophagitis: Secondary | ICD-10-CM | POA: Diagnosis not present

## 2022-01-26 DIAGNOSIS — Z01812 Encounter for preprocedural laboratory examination: Secondary | ICD-10-CM

## 2022-01-26 DIAGNOSIS — M19012 Primary osteoarthritis, left shoulder: Secondary | ICD-10-CM | POA: Diagnosis not present

## 2022-01-26 DIAGNOSIS — E669 Obesity, unspecified: Secondary | ICD-10-CM | POA: Insufficient documentation

## 2022-01-26 DIAGNOSIS — K279 Peptic ulcer, site unspecified, unspecified as acute or chronic, without hemorrhage or perforation: Secondary | ICD-10-CM | POA: Insufficient documentation

## 2022-01-26 DIAGNOSIS — Z87891 Personal history of nicotine dependence: Secondary | ICD-10-CM | POA: Diagnosis not present

## 2022-01-26 DIAGNOSIS — Z7982 Long term (current) use of aspirin: Secondary | ICD-10-CM | POA: Diagnosis not present

## 2022-01-26 DIAGNOSIS — E119 Type 2 diabetes mellitus without complications: Secondary | ICD-10-CM | POA: Diagnosis not present

## 2022-01-26 DIAGNOSIS — Z6832 Body mass index (BMI) 32.0-32.9, adult: Secondary | ICD-10-CM | POA: Diagnosis not present

## 2022-01-26 DIAGNOSIS — I10 Essential (primary) hypertension: Secondary | ICD-10-CM | POA: Diagnosis not present

## 2022-01-26 DIAGNOSIS — K2270 Barrett's esophagus without dysplasia: Secondary | ICD-10-CM | POA: Diagnosis not present

## 2022-01-26 DIAGNOSIS — M7522 Bicipital tendinitis, left shoulder: Secondary | ICD-10-CM | POA: Insufficient documentation

## 2022-01-26 DIAGNOSIS — M75111 Incomplete rotator cuff tear or rupture of right shoulder, not specified as traumatic: Secondary | ICD-10-CM | POA: Diagnosis present

## 2022-01-26 HISTORY — PX: REVERSE SHOULDER ARTHROPLASTY: SHX5054

## 2022-01-26 LAB — GLUCOSE, CAPILLARY
Glucose-Capillary: 148 mg/dL — ABNORMAL HIGH (ref 70–99)
Glucose-Capillary: 176 mg/dL — ABNORMAL HIGH (ref 70–99)

## 2022-01-26 SURGERY — ARTHROPLASTY, SHOULDER, TOTAL, REVERSE
Anesthesia: Regional | Site: Shoulder | Laterality: Right

## 2022-01-26 MED ORDER — KETOROLAC TROMETHAMINE 15 MG/ML IJ SOLN
INTRAMUSCULAR | Status: AC
  Administered 2022-01-26: 15 mg via INTRAVENOUS
  Filled 2022-01-26: qty 1

## 2022-01-26 MED ORDER — FENTANYL CITRATE PF 50 MCG/ML IJ SOSY
PREFILLED_SYRINGE | INTRAMUSCULAR | Status: AC
  Administered 2022-01-26: 50 ug via INTRAVENOUS
  Filled 2022-01-26: qty 1

## 2022-01-26 MED ORDER — OXYCODONE HCL 5 MG/5ML PO SOLN: 5.0000 mg | Freq: Once | ORAL | Status: DC | PRN

## 2022-01-26 MED ORDER — EPHEDRINE SULFATE (PRESSORS) 50 MG/ML IJ SOLN
INTRAMUSCULAR | Status: DC | PRN
  Administered 2022-01-26: 5 mg via INTRAVENOUS
  Administered 2022-01-26: 10 mg via INTRAVENOUS
  Administered 2022-01-26: 5 mg via INTRAVENOUS

## 2022-01-26 MED ORDER — BUPIVACAINE HCL (PF) 0.5 % IJ SOLN
INTRAMUSCULAR | Status: AC
  Filled 2022-01-26: qty 10

## 2022-01-26 MED ORDER — ROCURONIUM BROMIDE 10 MG/ML (PF) SYRINGE
PREFILLED_SYRINGE | INTRAVENOUS | Status: AC
  Filled 2022-01-26: qty 10

## 2022-01-26 MED ORDER — BUPIVACAINE-EPINEPHRINE (PF) 0.5% -1:200000 IJ SOLN
INTRAMUSCULAR | Status: AC
  Filled 2022-01-26: qty 30

## 2022-01-26 MED ORDER — OXYCODONE HCL 5 MG PO TABS: 5.0000 mg | ORAL_TABLET | ORAL | 0 refills | Status: DC | PRN

## 2022-01-26 MED ORDER — IPRATROPIUM-ALBUTEROL 0.5-2.5 (3) MG/3ML IN SOLN
RESPIRATORY_TRACT | Status: AC
  Administered 2022-01-26: 3 mL
  Filled 2022-01-26: qty 3

## 2022-01-26 MED ORDER — DEXAMETHASONE SODIUM PHOSPHATE 10 MG/ML IJ SOLN
INTRAMUSCULAR | Status: AC
  Filled 2022-01-26: qty 1

## 2022-01-26 MED ORDER — SUGAMMADEX SODIUM 200 MG/2ML IV SOLN
INTRAVENOUS | Status: DC | PRN
  Administered 2022-01-26: 200 mg via INTRAVENOUS

## 2022-01-26 MED ORDER — CEFAZOLIN SODIUM-DEXTROSE 2-4 GM/100ML-% IV SOLN
INTRAVENOUS | Status: AC
  Filled 2022-01-26: qty 100

## 2022-01-26 MED ORDER — FENTANYL CITRATE (PF) 100 MCG/2ML IJ SOLN
INTRAMUSCULAR | Status: AC
  Filled 2022-01-26: qty 2

## 2022-01-26 MED ORDER — ONDANSETRON HCL 4 MG PO TABS: 4.0000 mg | ORAL_TABLET | Freq: Four times a day (QID) | ORAL | Status: DC | PRN

## 2022-01-26 MED ORDER — METOCLOPRAMIDE HCL 10 MG PO TABS: 5.0000 mg | ORAL_TABLET | Freq: Three times a day (TID) | ORAL | Status: DC | PRN

## 2022-01-26 MED ORDER — PROPOFOL 10 MG/ML IV BOLUS
INTRAVENOUS | Status: AC
  Filled 2022-01-26: qty 20

## 2022-01-26 MED ORDER — TRANEXAMIC ACID 1000 MG/10ML IV SOLN
INTRAVENOUS | Status: AC
  Filled 2022-01-26: qty 10

## 2022-01-26 MED ORDER — EPHEDRINE 5 MG/ML INJ
INTRAVENOUS | Status: AC
  Filled 2022-01-26: qty 5

## 2022-01-26 MED ORDER — CEFAZOLIN SODIUM-DEXTROSE 2-4 GM/100ML-% IV SOLN
2.0000 g | Freq: Four times a day (QID) | INTRAVENOUS | Status: DC
  Administered 2022-01-26: 2 g via INTRAVENOUS

## 2022-01-26 MED ORDER — LACTATED RINGERS IV SOLN: INTRAVENOUS | Status: DC

## 2022-01-26 MED ORDER — PROPOFOL 10 MG/ML IV BOLUS
INTRAVENOUS | Status: DC | PRN
  Administered 2022-01-26: 150 mg via INTRAVENOUS

## 2022-01-26 MED ORDER — PHENYLEPHRINE HCL-NACL 20-0.9 MG/250ML-% IV SOLN
INTRAVENOUS | Status: DC | PRN
  Administered 2022-01-26: 30 ug/min via INTRAVENOUS

## 2022-01-26 MED ORDER — ONDANSETRON HCL 4 MG/2ML IJ SOLN: 4.0000 mg | Freq: Four times a day (QID) | INTRAMUSCULAR | Status: DC | PRN

## 2022-01-26 MED ORDER — BUPIVACAINE-EPINEPHRINE (PF) 0.5% -1:200000 IJ SOLN
INTRAMUSCULAR | Status: DC | PRN
  Administered 2022-01-26: 30 mL

## 2022-01-26 MED ORDER — BUPIVACAINE LIPOSOME 1.3 % IJ SUSP
INTRAMUSCULAR | Status: AC
  Filled 2022-01-26: qty 10

## 2022-01-26 MED ORDER — SODIUM CHLORIDE 0.9 % IV SOLN: INTRAVENOUS | Status: DC

## 2022-01-26 MED ORDER — ACETAMINOPHEN 10 MG/ML IV SOLN: 1000.0000 mg | Freq: Once | INTRAVENOUS | Status: DC | PRN

## 2022-01-26 MED ORDER — BUPIVACAINE LIPOSOME 1.3 % IJ SUSP
INTRAMUSCULAR | Status: DC | PRN
  Administered 2022-01-26: 20 mL

## 2022-01-26 MED ORDER — OXYCODONE HCL 5 MG PO TABS: 5.0000 mg | ORAL_TABLET | ORAL | Status: DC | PRN

## 2022-01-26 MED ORDER — SODIUM CHLORIDE 0.9 % IR SOLN
Status: DC | PRN
  Administered 2022-01-26: 3000 mL

## 2022-01-26 MED ORDER — FENTANYL CITRATE PF 50 MCG/ML IJ SOSY: 50.0000 ug | PREFILLED_SYRINGE | Freq: Once | INTRAMUSCULAR | Status: AC

## 2022-01-26 MED ORDER — ONDANSETRON HCL 4 MG/2ML IJ SOLN
INTRAMUSCULAR | Status: AC
  Filled 2022-01-26: qty 2

## 2022-01-26 MED ORDER — ONDANSETRON HCL 4 MG/2ML IJ SOLN
INTRAMUSCULAR | Status: DC | PRN
  Administered 2022-01-26: 4 mg via INTRAVENOUS

## 2022-01-26 MED ORDER — PHENYLEPHRINE HCL (PRESSORS) 10 MG/ML IV SOLN
INTRAVENOUS | Status: AC
  Filled 2022-01-26: qty 1

## 2022-01-26 MED ORDER — OXYCODONE HCL 5 MG PO TABS: 5.0000 mg | ORAL_TABLET | Freq: Once | ORAL | Status: DC | PRN

## 2022-01-26 MED ORDER — LIDOCAINE HCL (PF) 2 % IJ SOLN
INTRAMUSCULAR | Status: AC
  Filled 2022-01-26: qty 5

## 2022-01-26 MED ORDER — ONDANSETRON HCL 4 MG/2ML IJ SOLN: 4.0000 mg | Freq: Once | INTRAMUSCULAR | Status: DC | PRN

## 2022-01-26 MED ORDER — DEXAMETHASONE SODIUM PHOSPHATE 10 MG/ML IJ SOLN
INTRAMUSCULAR | Status: DC | PRN
  Administered 2022-01-26: 4 mg via INTRAVENOUS

## 2022-01-26 MED ORDER — ROCURONIUM BROMIDE 100 MG/10ML IV SOLN
INTRAVENOUS | Status: DC | PRN
  Administered 2022-01-26: 50 mg via INTRAVENOUS

## 2022-01-26 MED ORDER — TRANEXAMIC ACID 1000 MG/10ML IV SOLN
INTRAVENOUS | Status: DC | PRN
  Administered 2022-01-26: 1000 mg via TOPICAL

## 2022-01-26 MED ORDER — FENTANYL CITRATE (PF) 100 MCG/2ML IJ SOLN: 25.0000 ug | INTRAMUSCULAR | Status: DC | PRN

## 2022-01-26 MED ORDER — CHLORHEXIDINE GLUCONATE 0.12 % MT SOLN
OROMUCOSAL | Status: AC
  Administered 2022-01-26: 15 mL via OROMUCOSAL
  Filled 2022-01-26: qty 15

## 2022-01-26 MED ORDER — 0.9 % SODIUM CHLORIDE (POUR BTL) OPTIME
TOPICAL | Status: DC | PRN
  Administered 2022-01-26: 500 mL

## 2022-01-26 MED ORDER — FENTANYL CITRATE (PF) 100 MCG/2ML IJ SOLN
INTRAMUSCULAR | Status: DC | PRN
  Administered 2022-01-26 (×2): 50 ug via INTRAVENOUS

## 2022-01-26 MED ORDER — KETOROLAC TROMETHAMINE 15 MG/ML IJ SOLN: 15.0000 mg | Freq: Once | INTRAMUSCULAR | Status: AC

## 2022-01-26 MED ORDER — DEXMEDETOMIDINE HCL IN NACL 200 MCG/50ML IV SOLN
INTRAVENOUS | Status: DC | PRN
  Administered 2022-01-26: 8 ug via INTRAVENOUS

## 2022-01-26 MED ORDER — METOCLOPRAMIDE HCL 5 MG/ML IJ SOLN: 5.0000 mg | Freq: Three times a day (TID) | INTRAMUSCULAR | Status: DC | PRN

## 2022-01-26 MED ORDER — BUPIVACAINE HCL (PF) 0.5 % IJ SOLN
INTRAMUSCULAR | Status: DC | PRN
  Administered 2022-01-26: 10 mL

## 2022-01-26 SURGICAL SUPPLY — 70 items
BASEPLATE BOSS DRILL (MISCELLANEOUS) IMPLANT
BIT DRILL 2.5 (BIT) ×1
BIT DRILL 2.5X4.5XSCR (BIT) IMPLANT
BIT DRILL F/BASEPLATE CENTRAL (BIT) IMPLANT
BIT DRL 2.5X4.5XSCR (BIT) ×1
BLADE SAW SAG 25X90X1.19 (BLADE) ×1 IMPLANT
CHLORAPREP W/TINT 26 (MISCELLANEOUS) ×1 IMPLANT
COOLER POLAR GLACIER W/PUMP (MISCELLANEOUS) ×1 IMPLANT
COVER BACK TABLE REUSABLE LG (DRAPES) ×1 IMPLANT
DRAPE 3/4 80X56 (DRAPES) ×1 IMPLANT
DRAPE INCISE IOBAN 66X45 STRL (DRAPES) ×1 IMPLANT
DRILL BASEPLATE CENTRAL  S (BIT) ×1
DRILL BASEPLATE CENTRAL S (BIT) ×1
DRSG OPSITE POSTOP 4X8 (GAUZE/BANDAGES/DRESSINGS) ×1 IMPLANT
ELECT BLADE 6.5 EXT (BLADE) IMPLANT
ELECT CAUTERY BLADE 6.4 (BLADE) ×1 IMPLANT
ELECT REM PT RETURN 9FT ADLT (ELECTROSURGICAL) ×1
ELECTRODE REM PT RTRN 9FT ADLT (ELECTROSURGICAL) ×1 IMPLANT
GAUZE XEROFORM 1X8 LF (GAUZE/BANDAGES/DRESSINGS) ×1 IMPLANT
GLENOSPHERE RSS 2 CONCENTRIC (Shoulder) IMPLANT
GLOVE BIO SURGEON STRL SZ7.5 (GLOVE) ×4 IMPLANT
GLOVE BIO SURGEON STRL SZ8 (GLOVE) ×4 IMPLANT
GLOVE BIOGEL PI IND STRL 8 (GLOVE) ×2 IMPLANT
GLOVE SURG UNDER LTX SZ8 (GLOVE) ×1 IMPLANT
GOWN STRL REUS W/ TWL LRG LVL3 (GOWN DISPOSABLE) ×1 IMPLANT
GOWN STRL REUS W/ TWL XL LVL3 (GOWN DISPOSABLE) ×1 IMPLANT
GOWN STRL REUS W/TWL LRG LVL3 (GOWN DISPOSABLE) ×1
GOWN STRL REUS W/TWL XL LVL3 (GOWN DISPOSABLE) ×1
GUIDE PIN 2.0 X 150 (WIRE) IMPLANT
HOOD PEEL AWAY T7 (MISCELLANEOUS) ×3 IMPLANT
IV NS IRRIG 3000ML ARTHROMATIC (IV SOLUTION) ×1 IMPLANT
KIT STABILIZATION SHOULDER (MISCELLANEOUS) ×1 IMPLANT
KIT TURNOVER KIT A (KITS) ×1 IMPLANT
LINER STD +0S RSS HXL (Liner) IMPLANT
MANIFOLD NEPTUNE II (INSTRUMENTS) ×1 IMPLANT
MASK FACE SPIDER DISP (MASK) ×1 IMPLANT
MAT ABSORB  FLUID 56X50 GRAY (MISCELLANEOUS) ×1
MAT ABSORB FLUID 56X50 GRAY (MISCELLANEOUS) ×1 IMPLANT
NDL MAYO CATGUT SZ1 (NEEDLE) IMPLANT
NDL SAFETY ECLIP 18X1.5 (MISCELLANEOUS) ×1 IMPLANT
NDL SPNL 20GX3.5 QUINCKE YW (NEEDLE) ×1 IMPLANT
NEEDLE MAYO CATGUT SZ1 (NEEDLE) IMPLANT
NEEDLE SPNL 20GX3.5 QUINCKE YW (NEEDLE) ×1 IMPLANT
NS IRRIG 500ML POUR BTL (IV SOLUTION) ×1 IMPLANT
PACK ARTHROSCOPY SHOULDER (MISCELLANEOUS) ×1 IMPLANT
PAD ARMBOARD 7.5X6 YLW CONV (MISCELLANEOUS) ×1 IMPLANT
PAD WRAPON POLAR SHDR UNIV (MISCELLANEOUS) ×1 IMPLANT
PLATE BASE REVERSE RSS S (Plate) IMPLANT
PULSAVAC PLUS IRRIG FAN TIP (DISPOSABLE) ×1
SCREW 4.5X15 RSS W CAP (Screw) IMPLANT
SCREW 4.5X25 RSS W CAP (Screw) IMPLANT
SCREW 4.5X35 RSS W CAP (Screw) IMPLANT
SCREW BODY REVERSE SMALL TITAN (Screw) IMPLANT
SLING ULTRA II M (MISCELLANEOUS) IMPLANT
SPONGE T-LAP 18X18 ~~LOC~~+RFID (SPONGE) ×2 IMPLANT
STAPLER SKIN PROX 35W (STAPLE) ×1 IMPLANT
STEM PRESS FIT SZ 10 TSS (Stem) IMPLANT
SUT ETHIBOND 0 MO6 C/R (SUTURE) ×1 IMPLANT
SUT FIBERWIRE #2 38 BLUE 1/2 (SUTURE) ×3
SUT VIC AB 0 CT1 36 (SUTURE) ×1 IMPLANT
SUT VIC AB 2-0 CT1 27 (SUTURE) ×2
SUT VIC AB 2-0 CT1 TAPERPNT 27 (SUTURE) ×2 IMPLANT
SUTURE FIBERWR #2 38 BLUE 1/2 (SUTURE) ×4 IMPLANT
SYR 10ML LL (SYRINGE) ×1 IMPLANT
SYR 30ML LL (SYRINGE) ×1 IMPLANT
SYR TOOMEY 50ML (SYRINGE) ×1 IMPLANT
TIP FAN IRRIG PULSAVAC PLUS (DISPOSABLE) ×1 IMPLANT
TRAP FLUID SMOKE EVACUATOR (MISCELLANEOUS) ×1 IMPLANT
WATER STERILE IRR 500ML POUR (IV SOLUTION) ×1 IMPLANT
WRAPON POLAR PAD SHDR UNIV (MISCELLANEOUS) ×1

## 2022-01-26 NOTE — Op Note (Signed)
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  Post-Op Diagnosis:   Same.  Procedure:   Reverse right total shoulder arthroplasty with biceps tenodesis.  Surgeon:   Pascal Lux, MD  Assistant:   Cameron Proud, PA-C  Anesthesia:   General endotracheal with an interscalene block using Exparel placed preoperatively by the anesthesiologist.  Findings:   As above.  Complications:   None  EBL:   150 cc  Fluids:   1000 cc crystalloid  UOP:   None  TT:   None  Drains:   None  Closure:   Staples  Implants:   All press-fit Integra system with a 10 mm stem, a small metaphyseal body, a +0 mm humeral platform, a mini baseplate, and a 38 mm concentric +2 mm laterally offset glenosphere.  Brief Clinical Note:   The patient is a 76 year old female with a history of gradually worsening right shoulder pain. The patient is status post 2 prior rotator cuff repairs. Her symptoms have progressed despite medications, activity modification, etc. Her history and examination are consistent with a recurrent rotator cuff tear. Her preoperative MRI scan demonstrated a recurrent partial-thickness rotator cuff tear with early cuff arthropathy. The patient presents at this time for a reverse right total shoulder arthroplasty.  Procedure:   The patient underwent placement of an interscalene block using Exparel by the anesthesiologist in the preoperative holding area before being brought into the operating room and lain in the supine position. The patient then underwent general endotracheal intubation and anesthesia before the patient was repositioned in the beach chair position using the beach chair positioner. The right shoulder and upper extremity were prepped with ChloraPrep solution before being draped sterilely. Preoperative antibiotics were administered.   A timeout was performed to verify the  appropriate surgical site before a standard anterior approach to the shoulder was made through an approximately 4-5 inch incision. The incision was carried down through the subcutaneous tissues to expose the deltopectoral fascia. The interval between the deltoid and pectoralis muscles was identified and this plane developed, retracting the cephalic vein laterally with the deltoid muscle. The conjoined tendon was identified. Its lateral margin was dissected and the Kolbel self-retraining retractor inserted. The "three sisters" were identified and cauterized. Bursal tissues were removed to improve visualization.   The biceps tendon was identified near the inferior aspect of the bicipital groove. A soft tissue tenodesis was performed by attaching the biceps tendon to the adjacent pectoralis major tendon using two #0 Ethibond interrupted sutures. The biceps tendon was then transected just proximal to the tenodesis site. The subscapularis tendon was released from its attachment to the lesser tuberosity 1 cm proximal to its insertion and several tagging sutures placed. The inferior capsule was released with care after identifying and protecting the axillary nerve. The proximal humeral cut was made at approximately 20 of retroversion using the extra-medullary guide.   Attention was redirected to the glenoid. The labrum was debrided circumferentially before the center of the glenoid was identified. The guidewire was drilled into the glenoid neck using the appropriate guide. After verifying its position, it was overreamed with the mini-baseplate reamer to create a flat surface before the stem reamer was utilized. The superior and inferior peg sites were reamed using the appropriate guide to complete the glenoid preparation. The permanent mini-baseplate was impacted into place. It was stabilized with a 25 x 4.5 mm  central screw and four peripheral screws. Locking caps were placed over the superior and inferior screws.  The permanent 38 mm concentric glenosphere with 2 mm of lateral offset was then impacted into place and its Morse taper locking mechanism verified using manual distraction.  Attention was directed to the humeral side. The humeral canal was prepared utilizing the tapered stem reamers sequentially beginning with the 7 mm stem and progressing to a 10 mm stem. This demonstrated a good tight fit. The metaphyseal region was then prepared using the appropriate planar device. The trial stem and small metaphyseal body were put together on the back table and a trial reduction performed using the +0 mm and +3 mm inserts. With the +0 mm insert, the arm demonstrated excellent range of motion as the hand could be brought across the chest to the opposite shoulder and brought to the top of the patient's head and to the patient's ear. The shoulder appeared stable throughout this range of motion. The joint was dislocated and the trial components removed. The permanent 10 mm stem with the small body was impacted into place with care taken to maintain the appropriate version. A repeat trial reduction with the +0 mm insert again demonstrated excellent stability with the findings as described above. Therefore, the shoulder was re-dislocated and, after inserting the locking screw to secure the body to the stem, the permanent +0 mm insert impacted into place. After verifying its locking mechanism, the shoulder was relocated using two finger pressure and again placed through a range of motion with the findings as described above.  The wound was copiously irrigated with sterile saline solution using the jet lavage system before 30 cc of 0.5% Sensorcaine with epinephrine was injected into the pericapsular and peri-incisional tissues to help with postoperative analgesia. The subscapularis tendon was reapproximated using #2 FiberWire interrupted sutures. The deltopectoral interval was closed using #0 Vicryl interrupted sutures before the  subcutaneous tissues were closed using 2-0 Vicryl interrupted sutures. The skin was closed using staples. Prior to closing the skin, 1 g of transexemic acid in 10 cc of normal saline was injected intra-articularly to help with postoperative bleeding. A sterile occlusive dressing was applied to the wound before the arm was placed into a shoulder immobilizer with an abduction pillow. A Polar Care system also was applied to the shoulder. The patient was then transferred back to a hospital bed before being awakened, extubated, and returned to the recovery room in satisfactory condition after tolerating the procedure well.

## 2022-01-26 NOTE — Anesthesia Preprocedure Evaluation (Addendum)
Anesthesia Evaluation  Patient identified by MRN, date of birth, ID band Patient awake    Reviewed: Allergy & Precautions, NPO status , Patient's Chart, lab work & pertinent test results  History of Anesthesia Complications Negative for: history of anesthetic complications  Airway Mallampati: IV   Neck ROM: Full    Dental  (+) Missing   Pulmonary former smoker (quit 1986)   Pulmonary exam normal breath sounds clear to auscultation       Cardiovascular hypertension, Normal cardiovascular exam+ dysrhythmias (PACs)  Rhythm:Regular Rate:Normal  ECG 11/01/21:  Sinus rhythm with Premature supraventricular complexes Otherwise normal ECG   Neuro/Psych negative neurological ROS     GI/Hepatic PUD,GERD (Barrett esophagus)  ,,  Endo/Other  diabetes, Type 2  Obesity   Renal/GU negative Renal ROS     Musculoskeletal  (+) Arthritis ,    Abdominal   Peds  Hematology negative hematology ROS (+)   Anesthesia Other Findings   Reproductive/Obstetrics                             Anesthesia Physical Anesthesia Plan  ASA: 2  Anesthesia Plan: General and Regional   Post-op Pain Management: Regional block*   Induction: Intravenous  PONV Risk Score and Plan: 3 and Ondansetron, Dexamethasone and Treatment may vary due to age or medical condition  Airway Management Planned: Oral ETT  Additional Equipment:   Intra-op Plan:   Post-operative Plan: Extubation in OR  Informed Consent: I have reviewed the patients History and Physical, chart, labs and discussed the procedure including the risks, benefits and alternatives for the proposed anesthesia with the patient or authorized representative who has indicated his/her understanding and acceptance.     Dental advisory given  Plan Discussed with: CRNA  Anesthesia Plan Comments: (Plan for preoperative interscalene nerve block and GETA.  Patient  consented for risks of anesthesia including but not limited to:  - adverse reactions to medications - damage to eyes, teeth, lips or other oral mucosa - nerve damage due to positioning  - sore throat or hoarseness - damage to heart, brain, nerves, lungs, other parts of body or loss of life  Informed patient about role of CRNA in peri- and intra-operative care.  Patient voiced understanding.)        Anesthesia Quick Evaluation

## 2022-01-26 NOTE — Discharge Instructions (Addendum)
Orthopedic discharge instructions: May shower with intact OpSite dressing after after nerve block has worn off (Monday).  Apply ice frequently to shoulder or use Polar Care device. Take ibuprofen 600 mg TID with meals for 3-5 days, then as necessary. Take oxycodone as prescribed when needed.  May supplement with ES Tylenol if necessary. Resume aspirin tomorrow morning. Keep shoulder immobilizer on at all times except may remove for bathing purposes. Follow-up in 10-14 days or as scheduled.  AMBULATORY SURGERY  DISCHARGE INSTRUCTIONS   The drugs that you were given will stay in your system until tomorrow so for the next 24 hours you should not:  Drive an automobile Make any legal decisions Drink any alcoholic beverage   You may resume regular meals tomorrow.  Today it is better to start with liquids and gradually work up to solid foods.  You may eat anything you prefer, but it is better to start with liquids, then soup and crackers, and gradually work up to solid foods.   Please notify your doctor immediately if you have any unusual bleeding, trouble breathing, redness and pain at the surgery site, drainage, fever, or pain not relieved by medication.    Additional Instructions:  Please contact your physician with any problems or Same Day Surgery at [phone], Monday through Friday 6 am to 4 pm, or [place] at [facility] number at [phone].   Breg Shoulder Sling Tutorial Video: [email or web address]

## 2022-01-26 NOTE — Evaluation (Signed)
Occupational Therapy Evaluation Patient Details Name: LESSA HUGE MRN: [id number] DOB: [birth year] Today's Date: 01/26/2022   History of Present Illness Pt is 76 y/o female s/p R REVERSE SHOULDER ARTHROPLASTY WITH BICEPS TENODESIS.   Clinical Impression   Upon entering the room, pt seated in recliner chair with daughter present in room for hands on education. Pt is pleasant and cooperative with no c/o pain. Pt reports living at home with husband who is able to assist her as needed at discharge. OT educated pt and her daughter on precautions, sling use, polar care, and hand and wrist gentle ROM exercises. Pt's daughter demonstrated understanding and returned demonstrations and pt fully dressed at end of session. Pt ambulates with close supervision 100' and O2 saturation at 92% on RA. RN notified. All needs within reach. Paper handout provided and no further questions at this time. OT to complete order.   Recommendations for follow up therapy are one component of a multi-disciplinary discharge planning process, led by the attending physician.  Recommendations may be updated based on patient status, additional functional criteria and insurance authorization.   Follow Up Recommendations  Follow physician's recommendations for discharge plan and follow up therapies     Assistance Recommended at Discharge Intermittent Supervision/Assistance  Patient can return home with the following A little help with bathing/dressing/bathroom;Help with stairs or ramp for entrance;Assist for transportation    Functional Status Assessment  Patient has had a recent decline in their functional status and demonstrates the ability to make significant improvements in function in a reasonable and predictable amount of time.  Equipment Recommendations  None recommended by OT       Precautions / Restrictions Precautions Precautions: Shoulder;Fall Shoulder Interventions: Don joy ultra sling;Shoulder abduction  pillow;At all times;Off for dressing/bathing/exercises Precaution Booklet Issued: Yes (comment) Required Braces or Orthoses: Sling Restrictions Weight Bearing Restrictions: Yes RUE Weight Bearing: Non weight bearing      Mobility Bed Mobility               General bed mobility comments: seated in recliner chair    Transfers Overall transfer level: Needs assistance Equipment used: None Transfers: Sit to/from Stand, Bed to chair/wheelchair/BSC Sit to Stand: Supervision                  Balance Overall balance assessment: Modified Independent                                         ADL either performed or assessed with clinical judgement   ADL Overall ADL's : Needs assistance/impaired                                       General ADL Comments: mod A to manage shirt and max A with education for daughter to assist with donning sling and polar care system     Vision Patient Visual Report: No change from baseline              Pertinent Vitals/Pain Pain Assessment Pain Assessment: No/denies pain     Hand Dominance Right   Extremity/Trunk Assessment Upper Extremity Assessment Upper Extremity Assessment: RUE deficits/detail RUE Deficits / Details: secondary to surgery RUE: Unable to fully assess due to immobilization   Lower Extremity Assessment Lower Extremity Assessment: Overall WFL for tasks assessed  Communication Communication Communication: No difficulties   Cognition Arousal/Alertness: Awake/alert Behavior During Therapy: WFL for tasks assessed/performed Overall Cognitive Status: Within Functional Limits for tasks assessed                                                  Home Living Family/patient expects to be discharged to:: Private residence Living Arrangements: Spouse/significant other Available Help at Discharge: Family;Available 24 hours/day Type of Home: House Home  Access: Stairs to enter [employer] of Steps: 2 Entrance Stairs-Rails: Left Home Layout: One level     Bathroom Shower/Tub: Teacher, early years/pre: Standard     Home Equipment: Cane - single point          Prior Functioning/Environment Prior Level of Function : Independent/Modified Independent                                 OT Goals(Current goals can be found in the care plan section) Acute Rehab OT Goals Patient Stated Goal: to return home OT Goal Formulation: With patient/family Time For Goal Achievement: 01/26/22 Potential to Achieve Goals: Good  OT Frequency:         AM-PAC OT "6 Clicks" Daily Activity     Outcome Measure Help from another person eating meals?: None Help from another person taking care of personal grooming?: None Help from another person toileting, which includes using toliet, bedpan, or urinal?: A Little Help from another person bathing (including washing, rinsing, drying)?: A Little Help from another person to put on and taking off regular upper body clothing?: A Little Help from another person to put on and taking off regular lower body clothing?: A Little 6 Click Score: 20   End of Session Nurse Communication: Mobility status;Other (comment) (O2 saturation)  Activity Tolerance: Patient tolerated treatment well Patient left: in chair;with family/visitor present  OT Visit Diagnosis: Unsteadiness on feet (R26.81);Repeated falls (R29.6);Muscle weakness (generalized) (M62.81)                Time: 3790-2409 OT Time Calculation (min): 32 min Charges:  OT General Charges $OT Visit: 1 Visit OT Evaluation $OT Eval Low Complexity: 1 Low OT Treatments $Self Care/Home Management : 8-22 mins  Darleen Crocker, MS, OTR/L , CBIS ascom [phone]  01/26/22, 4:22 PM

## 2022-01-26 NOTE — H&P (Signed)
History of Present Illness: Becky Gordon is a 76 y.o. who presents today for history and physical. She is to undergo a reverse right total shoulder arthroplasty on 01/26/2022. Since her last visit to the clinic she has not seen any improvement in her condition and wishes to proceed with having surgery of the right shoulder.  Patient has a chronic history of right shoulder pathology and has had a previous arthroscopy with rotator cuff repair. 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 The patient notes little change in her symptoms since she was last evaluated 2.5 months ago. She tried to attend physical therapy and was able to get through 3-4 sessions before both she and the physical therapist agreed that she should stop due to continued pain and failure to show any improvement. She rates her pain at 4/10 on today's visit, but notes that her symptoms can be worse with certain motions or activities, especially at or above shoulder level or when trying to reach behind her back. She also has pain at night. She denies any reinjury to her shoulder since her last visit, and denies any numbness or paresthesias down her arm to her hand. She is quite frustrated by her symptoms and function limit   Past Medical History: DM II (diabetes mellitus, type II), controlled (CMS-HCC)  Eczema, unspecified  Hives  GERD (gastroesophageal reflux disease)  GERD without esophagitis 03/19/2014  History of Barrett's esophagus 03/19/2014  Osteoarthritis  PAC (premature atrial contraction)  Ulcer   Past Surgical History: KNEE ARTHROSCOPY 1989  RIGHT HAND CARPAL TUNNEL RELEASE 1998  LEFT ROTATOR CUFF REPAIR 2007  COLONOSCOPY 10/13/2005 (Hyperplastic Polyp: CBF 09/2015)  EGD 08/16/2011 (No Barrett's Seen: No repeat per RTE)  COLONOSCOPY 06/05/2016 (Hyperplastic Polyps: CBF 05/2026)  EGD 11/09/2008, 10/13/2005 (Barrett's  Esophagus)  RIGHT ROTATOR CUFF REPAIR 2000 and 2008   Past Family History: Heart disease Mother  Throat cancer Father  Heart disease Sister  Deep vein thrombosis (DVT or abnormal blood clot formation) Sister  Gallbladder disease Sister   Medications: amLODIPine (NORVASC) 5 MG tablet Take 1 tablet by mouth once daily  aspirin 81 MG EC tablet Take 81 mg by mouth daily.  CALCIUM CARBONATE (CALCIUM 500 ORAL) Take by mouth. (Patient not taking: Reported on 01/23/2022)  gabapentin (NEURONTIN) 100 MG capsule Take 1 capsule (100 mg total) by mouth 3 (three) times daily for 30 days If no improvement in 4 to 5 days increase to taking 2 tablets 3 times a day 90 capsule 0  hydroCHLOROthiazide (MICROZIDE) 12.5 mg capsule TAKE 1 CAPSULE BY MOUTH EVERY DAY IN THE MORNING  ibuprofen 200 mg Cap Take 400 mg by mouth every 4 (four) hours as needed (Patient not taking: Reported on 01/23/2022)  metFORMIN (GLUCOPHAGE) 500 MG tablet Take 500 mg by mouth 2 (two) times daily  multivitamin capsule Take 1 capsule by mouth daily. (Patient not taking: Reported on 01/23/2022)  omeprazole (PRILOSEC) 20 MG DR capsule TAKE 1 CAPSULE BY MOUTH EVERY DAY 90 capsule 1  ONETOUCH DELICA LANCETS 33 gauge Misc  ONETOUCH VERIO TEST STRIPS test strip USE 1 TEST STRIP TWICE A DAY   Allergies: Lodine [Etodolac] Hives  Celecoxib Other (See Comments)  Gabapentin Dizziness  Prednisone Hives   Review of Systems A comprehensive 14 point ROS was performed, reviewed, and the pertinent orthopaedic findings are documented in the HPI.  Physical Exam: BP 132/70 (BP Location: Left upper arm, Patient Position: Sitting,  BP Cuff Size: Large Adult)  Ht 165.1 cm ('5\' 5"'$ )  Wt 91.4 kg (201 lb 9.6 oz)  BMI 33.55 kg/m   General: Well-developed well-nourished female seen in no acute distress.   HEENT: Atraumatic,normocephalic. Pupils are equal and reactive to light. Oropharynx is clear with moist mucosa  Lungs: Clear to auscultation  bilaterally   Cardiovascular: Regular rate and rhythm. Normal S1, S2. Grade 2/6 systolic murmur. No appreciable gallops or rubs. Peripheral pulses are palpable.  Abdomen: Soft, non-tender, nondistended. Bowel sounds present  Right shoulder exam: SKIN: Well-healed surgical incision, otherwise unremarkable SWELLING: None WARMTH: None LYMPH NODES: No adenopathy palpable CREPITUS: None TENDERNESS: Mild tenderness along lateral acromion ROM (active):  Forward flexion: 130 degrees Abduction: 120 degrees Internal rotation: Right PSIS ROM (passive):  Forward flexion: 140 degrees Abduction: 130 degrees ER/IR at 90 abd: 90 degrees/50 degrees  She describes moderate pain with all motions.  STRENGTH: Forward flexion: 4/5 Abduction: 4-4+/5 External rotation: 4-4+/5 Internal rotation: 4+/5 Pain with RC testing: Mild pain with resisted forward flexion more so than with resisted abduction and external rotation  STABILITY: Normal  SPECIAL TESTS: Luan Pulling' test: Mildly positive Speed's test: Minimally positive Capsulitis - pain w/ passive ER: No Crossed arm test: No Crank: Not evaluated Anterior apprehension: Negative Posterior apprehension: Not evaluated  Neurological: The patient is alert and oriented Sensation to light touch appears to be intact and within normal limits Gross motor strength appeared to be equal to 5/5  Vascular : Peripheral pulses felt to be palpable. Capillary refill appears to be intact and within normal limits  Imaging: Films taken in [place] [facility] demonstrate postsurgical changes as manifest by 2 retained corkscrew anchors in the proximal humerus, consistent with her prior rotator cuff repair. There is no evidence for fractures, lytic lesions, or significant degenerative changes. The subacromial space is mildly decreased. There is no subacromial or infra-clavicular spurring. In fact, the acromion demonstrates postsurgical changes consistent with a prior  partial acromionectomy as well as a distal clavicle excision. She demonstrates a Type I acromion.  Right Shoulder Imaging, MRI: MRI Shoulder Cartilage: Partial thickness humeral head cartilage loss. MRI Shoulder Rotator Cuff: Partial thickness tear of the supraspinatus. No retraction. MRI Shoulder Labrum / Biceps: Biceps tendinopathy. MRI Shoulder Bone: Normal bone.   Impression: 1. Nontraumatic incomplete tear of right rotator cuff 2. Tendinitis upper bicep tendon right shoulder 3. Status post right rotator cuff repair  Plan:  The treatment options were discussed with the patient and her husband. In addition, patient educational materials were provided regarding the diagnosis and treatment options. The patient again is quite frustrated by her symptoms and function limitations, and is ready to consider more aggressive treatment options. Therefore, I have recommended a surgical procedure, specifically a reverse right total shoulder arthroplasty with biceps tenodesis. The procedure was discussed with the patient, as were the potential risks (including bleeding, infection, nerve and/or blood vessel injury, persistent or recurrent pain, loosening and/or failure of the components, dislocation, need for further surgery, blood clots, strokes, heart attacks and/or arhythmias, pneumonia, etc.) and benefits. The patient states her understanding and wishes to proceed. All of the patient's questions and concerns were answered. She can call any time with further concerns. She will follow up post-surgery, routine.    H&P reviewed and patient re-examined. No changes.

## 2022-01-26 NOTE — Anesthesia Procedure Notes (Signed)
Procedure Name: Intubation Date/Time: 01/26/2022 10:46 AM  Performed by: Carmelina Paddock, RNPre-anesthesia Checklist: Patient identified, Emergency Drugs available, Suction available and Patient being monitored Patient Re-evaluated:Patient Re-evaluated prior to induction Oxygen Delivery Method: Circle system utilized Preoxygenation: Pre-oxygenation with 100% oxygen Induction Type: IV induction Ventilation: Mask ventilation without difficulty Laryngoscope Size: McGraph and 3 Grade View: Grade I Tube type: Oral Tube size: 7.0 mm Number of attempts: 1 Airway Equipment and Method: Stylet and Oral airway Placement Confirmation: ETT inserted through vocal cords under direct vision, positive ETCO2 and breath sounds checked- equal and bilateral Secured at: 21 cm Tube secured with: Tape Dental Injury: Teeth and Oropharynx as per pre-operative assessment

## 2022-01-26 NOTE — Transfer of Care (Signed)
Immediate Anesthesia Transfer of Care Note  Patient: Becky Gordon  Procedure(s) Performed: REVERSE SHOULDER ARTHROPLASTY WITH BICEPS TENODESIS. (Right: Shoulder)  Patient Location: PACU  Anesthesia Type:General, Regional and GA combined with regional for post-op pain  Level of Consciousness: awake, alert , oriented and patient cooperative  Airway & Oxygen Therapy: Patient Spontanous Breathing and Patient connected to face mask oxygen  Post-op Assessment: Report given to RN and Post -op Vital signs reviewed and stable  Post vital signs: stable  Last Vitals:  Vitals Value Taken Time  BP 130/65 01/26/22 1341  Temp    Pulse 79 01/26/22 1345  Resp 16 01/26/22 1345  SpO2 97 % 01/26/22 1345  Vitals shown include unvalidated device data.  Last Pain:  Vitals:   01/26/22 0826  TempSrc: Tympanic  PainSc:          Complications: No notable events documented.

## 2022-01-26 NOTE — Anesthesia Postprocedure Evaluation (Signed)
Anesthesia Post Note  Patient: Becky Gordon  Procedure(s) Performed: REVERSE SHOULDER ARTHROPLASTY WITH BICEPS TENODESIS. (Right: Shoulder)  Patient location during evaluation: PACU Anesthesia Type: Regional Level of consciousness: awake and alert, oriented and patient cooperative Pain management: pain level controlled Vital Signs Assessment: post-procedure vital signs reviewed and stable Respiratory status: spontaneous breathing, nonlabored ventilation and respiratory function stable Cardiovascular status: blood pressure returned to baseline and stable Postop Assessment: adequate PO intake Anesthetic complications: no   No notable events documented.   Last Vitals:  Vitals:   01/26/22 1400 01/26/22 1415  BP: 125/70 122/68  Pulse: 76 79  Resp: 16 (!) 23  Temp:    SpO2: 90% 90%    Last Pain:  Vitals:   01/26/22 1415  TempSrc:   PainSc: 0-No pain                 Darrin Nipper

## 2022-01-26 NOTE — Anesthesia Procedure Notes (Signed)
Anesthesia Regional Block: Interscalene brachial plexus block   Pre-Anesthetic Checklist: , timeout performed,  Correct Patient, Correct Site, Correct Laterality,  Correct Procedure,, risks and benefits discussed,  Surgical consent,  Pre-op evaluation,  At surgeon's request and post-op pain management  Laterality: Right  Prep: chloraprep       Needles:  Injection technique: Single-shot  Needle Type: Echogenic Needle          Additional Needles:   Procedures:,,,, ultrasound used (permanent image in chart),,   Motor weakness within 20 minutes.  Narrative:  Start time: 01/26/2022 9:45 AM End time: 01/26/2022 9:47 AM Injection made incrementally with aspirations every 5 mL.  Performed by: Personally  Anesthesiologist: Darrin Nipper, MD  Additional Notes: Functioning IV was confirmed and monitors applied.  Sterile prep and drape, hand hygiene and sterile gloves were used. Ultrasound guidance: relevant anatomy identified, needle position confirmed, local anesthetic spread visualized around nerve(s), vascular puncture avoided.  Image saved to electronic medical record.  Negative aspiration prior to incremental administration of local anesthetic for total 20 ml Exparel and 10 ml bupivacaine 0.5% given in interscalene distribution. The patient tolerated the procedure well. Vital signs and moderate sedation medications recorded in RN notes.

## 2022-01-27 ENCOUNTER — Encounter: Payer: Self-pay | Admitting: Surgery

## 2022-01-27 DIAGNOSIS — M12811 Other specific arthropathies, not elsewhere classified, right shoulder: Secondary | ICD-10-CM | POA: Insufficient documentation

## 2022-01-27 DIAGNOSIS — Z96611 Presence of right artificial shoulder joint: Secondary | ICD-10-CM | POA: Insufficient documentation

## 2022-01-30 LAB — SURGICAL PATHOLOGY

## 2022-05-17 NOTE — Progress Notes (Signed)
Referring Physician:  Corky Mull, MD [place] [facility] [place],  [place] [address]  Primary Physician:  Lenard Simmer, MD  History of Present Illness: 05/17/2022 Ms. Becky Gordon is here today with a chief complaint of back and right leg pain.  Her pain is worst in her right leg into her buttock and down her posterior lateral thigh and into her anterolateral calf.  This particular happens when she bends or stands in 1 position.  She can walk extended distances, but has increasing pain as she walks.  Her husband reports that she bends forward when she walks.  Tramadol has helped, as to sitting down.  Bowel/Bladder Dysfunction: none  Conservative measures: chiropractor (Dr Rock Nephew) Physical therapy:  has not participated Multimodal medical therapy including regular antiinflammatories:  gabapentin, tylenol, ibuprofen, tramadol  Injections:  has received epidural steroid injections 04/10/2022: Right L4-5 transforaminal ESI (moderate relief for 1 week) 01/04/2022: Right L4-5 transforaminal ESI (good relief)   Past Surgery: denies  Becky Gordon has no symptoms of cervical myelopathy.  The symptoms are causing a significant impact on the patient's life.   I have utilized the care everywhere function in epic to review the outside records available from external health systems.  Review of Systems:  A 10 point review of systems is negative, except for the pertinent positives and negatives detailed in the HPI.  Past Medical History: Past Medical History:  Diagnosis Date   Arthritis    OSTEOARTHRITIS   Diabetes mellitus without complication (HCC)    Eczema    GERD (gastroesophageal reflux disease)    History of Barrett's esophagus    Hypertension    PAC (premature atrial contraction)    Ulcer     Past Surgical History: Past Surgical History:  Procedure Laterality Date   ABDOMINAL HYSTERECTOMY  1976   BREAST BIOPSY Right 2000   negative    CARPAL TUNNEL RELEASE Right 1998   COLONOSCOPY     COLONOSCOPY WITH PROPOFOL N/A 06/05/2016   Procedure: COLONOSCOPY WITH PROPOFOL;  Surgeon: Manya Silvas, MD;  Location: [place];  Service: Endoscopy;  Laterality: N/A;   CYSTOCELE REPAIR  2007   KNEE ARTHROSCOPY Left 2013   REVERSE SHOULDER ARTHROPLASTY Right 01/26/2022   Procedure: REVERSE SHOULDER ARTHROPLASTY WITH BICEPS TENODESIS.;  Surgeon: Corky Mull, MD;  Location: [facility];  Service: Orthopedics;  Laterality: Right;   ROTATOR CUFF REPAIR Bilateral 2007   UPPER GI ENDOSCOPY      Allergies: Allergies as of 05/18/2022 - Review Complete 01/26/2022  Allergen Reaction Noted   Celecoxib  10/06/2014   Etodolac  10/06/2014   Gabapentin Other (See Comments) 01/19/2022   Prednisone Hives 10/06/2014    Medications: No outpatient medications have been marked as taking for the 05/18/22 encounter (Appointment) with Meade Maw, MD.    Social History: Social History   Tobacco Use   Smoking status: Former    Years: 0    Types: Cigarettes   Smokeless tobacco: Never   Tobacco comments:    QUIT IN 1988  Vaping Use   Vaping Use: Never used  Substance Use Topics   Alcohol use: No    Alcohol/week: 0.0 standard drinks of alcohol   Drug use: No    Family Medical History: Family History  Problem Relation Age of Onset   Heart attack Mother    Cancer Father    Heart attack Sister    CAD Sister    Kidney failure Sister  Asthma Sister    CAD Sister    Heart disease Sister    Breast cancer Neg Hx     Physical Examination: There were no vitals filed for this visit.  General: Patient is well developed, well nourished, calm, collected, and in no apparent distress. Attention to examination is appropriate.  Neck:   Supple.  Full range of motion.  Respiratory: Patient is breathing without any difficulty.   NEUROLOGICAL:     Awake, alert, oriented to person, place, and time.  Speech is clear and fluent.    Cranial Nerves: Pupils equal round and reactive to light.  Facial tone is symmetric.  Facial sensation is symmetric. Shoulder shrug is symmetric. Tongue protrusion is midline.  There is no pronator drift.  ROM of spine: full.    Strength: Side Biceps Triceps Deltoid Interossei Grip Wrist Ext. Wrist Flex.  R 5 5 5 5 5 5 5   L 5 5 5 5 5 5 5    Side Iliopsoas Quads Hamstring PF DF EHL  R 5 5 5 5 5 5   L 5 5 5 5 5 5    Reflexes are 1+ and symmetric at the biceps, triceps, brachioradialis, patella and achilles.   Hoffman's is absent.   Bilateral upper and lower extremity sensation is intact to light touch.    No evidence of dysmetria noted.  Gait is normal.     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 Mild to moderate foraminal narrowing at L4-5 is worse on the left.   Moderate central canal stenosis L3-4 where there is also mild bilateral foraminal narrowing.   Mild left lateral recess and moderate left foraminal narrowing at L5-S1. The central canal and right foramen are open.     Electronically Signed   By: Inge Rise M.D.   On: 01/03/2022 13:09  I have personally reviewed the images and agree with the above interpretation.  Assessment and Plan: Becky Gordon is a pleasant 77 y.o. 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  She has significant symptoms from this.  She has not exhausted conservative management.  I recommended physical therapy.  If she does not do well with physical therapy, we will discuss L3-5 decompression.  I will see her back in 8 weeks.    Thank you for involving me in the care of this patient.      Aleia Larocca K. Izora Ribas MD, [facility] Neurosurgery

## 2022-05-18 ENCOUNTER — Encounter: Payer: Self-pay | Admitting: Neurosurgery

## 2022-05-18 ENCOUNTER — Ambulatory Visit: Payer: Medicare HMO | Admitting: Neurosurgery

## 2022-05-18 VITALS — BP 130/72 | Ht 65.5 in | Wt 199.0 lb

## 2022-05-18 DIAGNOSIS — M48062 Spinal stenosis, lumbar region with neurogenic claudication: Secondary | ICD-10-CM

## 2022-07-05 ENCOUNTER — Encounter: Payer: Medicare HMO | Admitting: Obstetrics and Gynecology

## 2022-07-18 ENCOUNTER — Encounter: Payer: Self-pay | Admitting: Neurosurgery

## 2022-07-18 ENCOUNTER — Ambulatory Visit: Payer: Medicare HMO | Admitting: Neurosurgery

## 2022-07-18 VITALS — BP 134/84 | Ht 65.5 in | Wt 203.0 lb

## 2022-07-18 DIAGNOSIS — M48062 Spinal stenosis, lumbar region with neurogenic claudication: Secondary | ICD-10-CM

## 2022-07-18 NOTE — Progress Notes (Signed)
Referring Physician:  Alan Mulder, MD [address] [place],  [place] [address]  Primary Physician:  Alan Mulder, MD  History of Present Illness: 07/18/2022 Becky Gordon returns to see me.  She has been doing physical therapy without improvement.  05/18/2022 Becky Gordon is here today with a chief complaint of back and right leg pain.  Her pain is worst in her right leg into her buttock and down her posterior lateral thigh and into her anterolateral calf.  This particular happens when she bends or stands in 1 position.  She can walk extended distances, but has increasing pain as she walks.  Her husband reports that she bends forward when she walks.  Tramadol has helped, as to sitting down.  Bowel/Bladder Dysfunction: none  Conservative measures: chiropractor (Dr Anner Crete) Physical therapy:  has not participated Multimodal medical therapy including regular antiinflammatories:  gabapentin, tylenol, ibuprofen, tramadol  Injections:  has received epidural steroid injections 04/10/2022: Right L4-5 transforaminal ESI (moderate relief for 1 week) 01/04/2022: Right L4-5 transforaminal ESI (good relief)   Past Surgery: denies  Becky Gordon has no symptoms of cervical myelopathy.  The symptoms are causing a significant impact on the patient's life.   I have utilized the care everywhere function in epic to review the outside records available from external health systems.  Review of Systems:  A 10 point review of systems is negative, except for the pertinent positives and negatives detailed in the HPI.  Past Medical History: Past Medical History:  Diagnosis Date   Arthritis    OSTEOARTHRITIS   Diabetes mellitus without complication (HCC)    Eczema    GERD (gastroesophageal reflux disease)    History of Barrett's esophagus    Hypertension    PAC (premature atrial contraction)    Ulcer     Past Surgical History: Past Surgical History:  Procedure Laterality  Date   ABDOMINAL HYSTERECTOMY  1976   BREAST BIOPSY Right 2000   negative   CARPAL TUNNEL RELEASE Right 1998   COLONOSCOPY     COLONOSCOPY WITH PROPOFOL N/A 06/05/2016   Procedure: COLONOSCOPY WITH PROPOFOL;  Surgeon: Scot Jun, MD;  Location: [facility] ENDOSCOPY;  Service: Endoscopy;  Laterality: N/A;   CYSTOCELE REPAIR  2007   KNEE ARTHROSCOPY Left 2013   REVERSE SHOULDER ARTHROPLASTY Right 01/26/2022   Procedure: REVERSE SHOULDER ARTHROPLASTY WITH BICEPS TENODESIS.;  Surgeon: Christena Flake, MD;  Location: [facility];  Service: Orthopedics;  Laterality: Right;   ROTATOR CUFF REPAIR Bilateral 2007   UPPER GI ENDOSCOPY      Allergies: Allergies as of 07/18/2022 - Review Complete 05/18/2022  Allergen Reaction Noted   Celecoxib  10/06/2014   Etodolac  10/06/2014   Gabapentin Other (See Comments) 12/26/2021   Prednisone Hives 10/06/2014    Medications: No outpatient medications have been marked as taking for the 07/18/22 encounter (Appointment) with Venetia Night, MD.    Social History: Social History   Tobacco Use   Smoking status: Former    Years: 0    Types: Cigarettes   Smokeless tobacco: Never   Tobacco comments:    QUIT IN 1988  Vaping Use   Vaping Use: Never used  Substance Use Topics   Alcohol use: No    Alcohol/week: 0.0 standard drinks of alcohol   Drug use: No    Family Medical History: Family History  Problem Relation Age of Onset   Heart attack Mother    Cancer Father    Heart  attack Sister    CAD Sister    Kidney failure Sister    Asthma Sister    CAD Sister    Heart disease Sister    Breast cancer Neg Hx     Physical Examination: There were no vitals filed for this visit.  General: Patient is well developed, well nourished, calm, collected, and in no apparent distress. Attention to examination is appropriate.  Neck:   Supple.  Full range of motion.  Respiratory: Patient is breathing without any difficulty.   NEUROLOGICAL:      Awake, alert, oriented to person, place, and time.  Speech is clear and fluent.   Cranial Nerves: Pupils equal round and reactive to light.  Facial tone is symmetric.  Facial sensation is symmetric. Shoulder shrug is symmetric. Tongue protrusion is midline.  There is no pronator drift.  ROM of spine: full.    Strength: Side Biceps Triceps Deltoid Interossei Grip Wrist Ext. Wrist Flex.  R 5 5 5 5 5 5 5   L 5 5 5 5 5 5 5    Side Iliopsoas Quads Hamstring PF DF EHL  R 5 5 5 5 5 5   L 5 5 5 5 5 5    Reflexes are 1+ and symmetric at the biceps, triceps, brachioradialis, patella and achilles.   Hoffman's is absent.   Bilateral upper and lower extremity sensation is intact to light touch.    No evidence of dysmetria noted.  Gait is normal.     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 Mild to moderate foraminal narrowing at L4-5 is worse on the left.   Moderate central canal stenosis L3-4 where there is also mild bilateral foraminal narrowing.   Mild left lateral recess and moderate left foraminal narrowing at L5-S1. The central canal and right foramen are open.     Electronically Signed   By: Drusilla Kanner M.D.   On: 01/03/2022 13:09  I have personally reviewed the images and agree with the above interpretation.  Assessment and Plan: Ms. Abdala is a pleasant 77 y.o. 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  She has significant symptoms from this.  She has failed conservative management.  I recommended L3-5 decompression.  No further conservative management is indicated.   I discussed the planned procedure at length with the patient, including the risks, benefits, alternatives, and indications. The risks discussed include but are not limited to bleeding,  infection, need for reoperation, spinal fluid leak, stroke, vision loss, anesthetic complication, coma, paralysis, and even death. I also described in detail that improvement was not guaranteed.  The patient expressed understanding of these risks, and asked that we proceed with surgery. I described the surgery in layman's terms, and gave ample opportunity for questions, which were answered to the best of my ability.  Thank you for involving me in the care of this patient.      Kenyette Gundy K. Myer Haff MD, [facility] Neurosurgery

## 2022-07-18 NOTE — H&P (View-Only) (Signed)
  Referring Physician:  Morayati, Shamil J, MD [address] ,  [place] [address]  Primary Physician:  Morayati, Shamil J, MD  History of Present Illness: 07/18/2022 Becky Gordon returns to see me.  She has been doing physical therapy without improvement.  05/18/2022 Becky Gordon is here today with a chief complaint of back and right leg pain.  Her pain is worst in her right leg into her buttock and down her posterior lateral thigh and into her anterolateral calf.  This particular happens when she bends or stands in 1 position.  She can walk extended distances, but has increasing pain as she walks.  Her husband reports that she bends forward when she walks.  Tramadol has helped, as to sitting down.  Bowel/Bladder Dysfunction: none  Conservative measures: chiropractor (Dr Wells) Physical therapy:  has not participated Multimodal medical therapy including regular antiinflammatories:  gabapentin, tylenol, ibuprofen, tramadol  Injections:  has received epidural steroid injections 04/10/2022: Right L4-5 transforaminal ESI (moderate relief for 1 week) 01/04/2022: Right L4-5 transforaminal ESI (good relief)   Past Surgery: denies  Becky Gordon has no symptoms of cervical myelopathy.  The symptoms are causing a significant impact on the patient's life.   I have utilized the care everywhere function in epic to review the outside records available from external health systems.  Review of Systems:  A 10 point review of systems is negative, except for the pertinent positives and negatives detailed in the HPI.  Past Medical History: Past Medical History:  Diagnosis Date   Arthritis    OSTEOARTHRITIS   Diabetes mellitus without complication (HCC)    Eczema    GERD (gastroesophageal reflux disease)    History of Barrett's esophagus    Hypertension    PAC (premature atrial contraction)    Ulcer     Past Surgical History: Past Surgical History:  Procedure Laterality  Date   ABDOMINAL HYSTERECTOMY  1976   BREAST BIOPSY Right 2000   negative   CARPAL TUNNEL RELEASE Right 1998   COLONOSCOPY     COLONOSCOPY WITH PROPOFOL N/A 06/05/2016   Procedure: COLONOSCOPY WITH PROPOFOL;  Surgeon: Robert T Elliott, MD;  Location: [facility]COPY;  Service: Endoscopy;  Laterality: N/A;   CYSTOCELE REPAIR  2007   KNEE ARTHROSCOPY Left 2013   REVERSE SHOULDER ARTHROPLASTY Right 01/26/2022   Procedure: REVERSE SHOULDER ARTHROPLASTY WITH BICEPS TENODESIS.;  Surgeon: Poggi, John J, MD;  Location: [facility] ORS;  Service: Orthopedics;  Laterality: Right;   ROTATOR CUFF REPAIR Bilateral 2007   UPPER GI ENDOSCOPY      Allergies: Allergies as of 07/18/2022 - Review Complete 05/18/2022  Allergen Reaction Noted   Celecoxib  10/06/2014   Etodolac  10/06/2014   Gabapentin Other (See Comments) 12/26/2021   Prednisone Hives 10/06/2014    Medications: No outpatient medications have been marked as taking for the 07/18/22 encounter (Appointment) with Even Budlong, MD.    Social History: Social History   Tobacco Use   Smoking status: Former    Years: 0    Types: Cigarettes   Smokeless tobacco: Never   Tobacco comments:    QUIT IN 1988  Vaping Use   Vaping Use: Never used  Substance Use Topics   Alcohol use: No    Alcohol/week: 0.0 standard drinks of alcohol   Drug use: No    Family Medical History: Family History  Problem Relation Age of Onset   Heart attack Mother    Cancer Father    Heart   attack Sister    CAD Sister    Kidney failure Sister    Asthma Sister    CAD Sister    Heart disease Sister    Breast cancer Neg Hx     Physical Examination: There were no vitals filed for this visit.  General: Patient is well developed, well nourished, calm, collected, and in no apparent distress. Attention to examination is appropriate.  Neck:   Supple.  Full range of motion.  Respiratory: Patient is breathing without any difficulty.   NEUROLOGICAL:      Awake, alert, oriented to person, place, and time.  Speech is clear and fluent.   Cranial Nerves: Pupils equal round and reactive to light.  Facial tone is symmetric.  Facial sensation is symmetric. Shoulder shrug is symmetric. Tongue protrusion is midline.  There is no pronator drift.  ROM of spine: full.    Strength: Side Biceps Triceps Deltoid Interossei Grip Wrist Ext. Wrist Flex.  R 5 5 5 5 5 5 5  L 5 5 5 5 5 5 5   Side Iliopsoas Quads Hamstring PF DF EHL  R 5 5 5 5 5 5  L 5 5 5 5 5 5   Reflexes are 1+ and symmetric at the biceps, triceps, brachioradialis, patella and achilles.   Hoffman's is absent.   Bilateral upper and lower extremity sensation is intact to light touch.    No evidence of dysmetria noted.  Gait is normal.     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 Mild to moderate foraminal narrowing at L4-5 is worse on the left.   Moderate central canal stenosis L3-4 where there is also mild bilateral foraminal narrowing.   Mild left lateral recess and moderate left foraminal narrowing at L5-S1. The central canal and right foramen are open.     Electronically Signed   By: Thomas  Dalessio M.D.   On: 01/03/2022 13:09  I have personally reviewed the images and agree with the above interpretation.  Assessment and Plan: Becky Gordon is a pleasant 77 y.o. 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  She has significant symptoms from this.  She has failed conservative management.  I recommended L3-5 decompression.  No further conservative management is indicated.   I discussed the planned procedure at length with the patient, including the risks, benefits, alternatives, and indications. The risks discussed include but are not limited to bleeding,  infection, need for reoperation, spinal fluid leak, stroke, vision loss, anesthetic complication, coma, paralysis, and even death. I also described in detail that improvement was not guaranteed.  The patient expressed understanding of these risks, and asked that we proceed with surgery. I described the surgery in layman's terms, and gave ample opportunity for questions, which were answered to the best of my ability.  Thank you for involving me in the care of this patient.      Treyten Monestime K. Arnav Cregg MD, MPHS Neurosurgery 

## 2022-07-18 NOTE — Patient Instructions (Signed)
Please see below for information in regards to your upcoming surgery:   Planned surgery: L3-5 decompression   Surgery date: 08/07/22 - you will find out your arrival time the business day before your surgery.   [facility] Pre-admit Testing: we will call you with a date/time for this. Pre-admit testing is located on the first floor of the [facility], [address] [facility] [address], [address]. Please bring all prescriptions in the original prescription bottles to your appointment, even if you have reviewed medications by phone with a pharmacy representative. Pre-op labs may be done at your pre-op appointment. You are not required to fast for these labs. Should you need to change your pre-op appointment, please call Pre-admit testing at [phone].    Surgical clearance: we will send a clearance form to Dr Patrecia Pace      Blood thinners:  Aspirin:    stop aspirin 7 days prior, resume aspirin 14 days after       Diabetes: Metformin: hold for 2 days prior to surgery    Home health physical therapy: [facility] will contact you regarding home health physical therapy for after surgery.Their number is [phone].    If you have FMLA/disability paperwork, please drop it off or fax it to [phone], attention Patty.   How to contact us:  If you have any questions/concerns before or after surgery, you can reach [place] at [phone], or you can send a mychart message. We can be reached by phone or mychart 8am-4pm, Monday-Friday.  *Please note: Calls after 4pm are forwarded to a third party answering service. Mychart messages are not routinely monitored during evenings, weekends, and holidays. Please call our office to contact the answering service for urgent concerns during non-business hours.     Appointments/FMLA & disability paperwork: Patty & Cristin  Nurse: Royston Cowper  Medical assistants: Laurann Montana Physician  Assistant's: Manning Charity & Drake Leach Surgeon: Venetia Night, MD

## 2022-07-19 ENCOUNTER — Other Ambulatory Visit: Payer: Self-pay

## 2022-07-19 DIAGNOSIS — Z01818 Encounter for other preprocedural examination: Secondary | ICD-10-CM

## 2022-07-24 ENCOUNTER — Other Ambulatory Visit: Payer: Self-pay | Admitting: Endocrinology

## 2022-07-24 DIAGNOSIS — Z1231 Encounter for screening mammogram for malignant neoplasm of breast: Secondary | ICD-10-CM

## 2022-07-26 ENCOUNTER — Encounter
Admission: RE | Admit: 2022-07-26 | Discharge: 2022-07-26 | Disposition: A | Discharge status: Home / Self Care | Payer: Medicare HMO | Source: Ambulatory Visit | Attending: Neurosurgery | Admitting: Neurosurgery

## 2022-07-26 DIAGNOSIS — M549 Dorsalgia, unspecified: Secondary | ICD-10-CM | POA: Diagnosis not present

## 2022-07-26 DIAGNOSIS — F419 Anxiety disorder, unspecified: Secondary | ICD-10-CM | POA: Insufficient documentation

## 2022-07-26 DIAGNOSIS — E785 Hyperlipidemia, unspecified: Secondary | ICD-10-CM | POA: Insufficient documentation

## 2022-07-26 DIAGNOSIS — Z634 Disappearance and death of family member: Secondary | ICD-10-CM | POA: Diagnosis not present

## 2022-07-26 DIAGNOSIS — Z01810 Encounter for preprocedural cardiovascular examination: Secondary | ICD-10-CM

## 2022-07-26 DIAGNOSIS — M48062 Spinal stenosis, lumbar region with neurogenic claudication: Secondary | ICD-10-CM | POA: Diagnosis not present

## 2022-07-26 DIAGNOSIS — I447 Left bundle-branch block, unspecified: Secondary | ICD-10-CM

## 2022-07-26 DIAGNOSIS — Z01818 Encounter for other preprocedural examination: Secondary | ICD-10-CM

## 2022-07-26 DIAGNOSIS — E669 Obesity, unspecified: Secondary | ICD-10-CM | POA: Insufficient documentation

## 2022-07-26 DIAGNOSIS — I10 Essential (primary) hypertension: Secondary | ICD-10-CM | POA: Diagnosis not present

## 2022-07-26 DIAGNOSIS — R0602 Shortness of breath: Secondary | ICD-10-CM

## 2022-07-26 DIAGNOSIS — E119 Type 2 diabetes mellitus without complications: Secondary | ICD-10-CM

## 2022-07-26 DIAGNOSIS — F32A Depression, unspecified: Secondary | ICD-10-CM | POA: Insufficient documentation

## 2022-07-26 DIAGNOSIS — Z6833 Body mass index (BMI) 33.0-33.9, adult: Secondary | ICD-10-CM | POA: Insufficient documentation

## 2022-07-26 DIAGNOSIS — Z87891 Personal history of nicotine dependence: Secondary | ICD-10-CM | POA: Diagnosis not present

## 2022-07-26 DIAGNOSIS — R9431 Abnormal electrocardiogram [ECG] [EKG]: Secondary | ICD-10-CM

## 2022-07-26 DIAGNOSIS — Z01812 Encounter for preprocedural laboratory examination: Secondary | ICD-10-CM

## 2022-07-26 DIAGNOSIS — Z8249 Family history of ischemic heart disease and other diseases of the circulatory system: Secondary | ICD-10-CM | POA: Diagnosis not present

## 2022-07-26 HISTORY — DX: Personal history of nicotine dependence: Z87.891

## 2022-07-26 HISTORY — DX: Urge incontinence: N39.41

## 2022-07-26 HISTORY — DX: Radiculopathy, lumbar region: M54.16

## 2022-07-26 HISTORY — DX: Pure hypercholesterolemia, unspecified: E78.00

## 2022-07-26 HISTORY — DX: Depression, unspecified: F32.A

## 2022-07-26 HISTORY — DX: Morbid (severe) obesity due to excess calories: E66.01

## 2022-07-26 HISTORY — DX: Anxiety disorder, unspecified: F41.9

## 2022-07-26 HISTORY — DX: Left bundle-branch block, unspecified: I44.7

## 2022-07-26 HISTORY — DX: Neoplasm of uncertain behavior of tongue: D37.02

## 2022-07-26 LAB — BASIC METABOLIC PANEL
Anion gap: 10 (ref 5–15)
BUN: 27 mg/dL — ABNORMAL HIGH (ref 8–23)
CO2: 26 mmol/L (ref 22–32)
Calcium: 9.3 mg/dL (ref 8.9–10.3)
Chloride: 103 mmol/L (ref 98–111)
Creatinine, Ser: 1.09 mg/dL — ABNORMAL HIGH (ref 0.44–1.00)
GFR, Estimated: 52 mL/min — ABNORMAL LOW (ref >60)
Glucose, Bld: 99 mg/dL (ref 70–99)
Potassium: 3.6 mmol/L (ref 3.5–5.1)
Sodium: 139 mmol/L (ref 135–145)

## 2022-07-26 LAB — HEMOGLOBIN A1C
Hgb A1c MFr Bld: 6.9 % — ABNORMAL HIGH (ref 4.8–5.6)
Mean Plasma Glucose: 151 mg/dL

## 2022-07-26 LAB — URINALYSIS, ROUTINE W REFLEX MICROSCOPIC
Bilirubin Urine: NEGATIVE
Glucose, UA: NEGATIVE mg/dL
Hgb urine dipstick: NEGATIVE
Ketones, ur: NEGATIVE mg/dL
Leukocytes,Ua: NEGATIVE
Nitrite: NEGATIVE
Protein, ur: NEGATIVE mg/dL
Specific Gravity, Urine: 1.021 (ref 1.005–1.030)
pH: 5 (ref 5.0–8.0)

## 2022-07-26 LAB — TYPE AND SCREEN
ABO/RH(D): B POS
Antibody Screen: NEGATIVE

## 2022-07-26 LAB — CBC
HCT: 38.9 % (ref 36.0–46.0)
Hemoglobin: 12.9 g/dL (ref 12.0–15.0)
MCH: 28.6 pg (ref 26.0–34.0)
MCHC: 33.2 g/dL (ref 30.0–36.0)
MCV: 86.3 fL (ref 80.0–100.0)
Platelets: 320 10*3/uL (ref 150–400)
RBC: 4.51 MIL/uL (ref 3.87–5.11)
RDW: 12.8 % (ref 11.5–15.5)
WBC: 6.4 10*3/uL (ref 4.0–10.5)
nRBC: 0 % (ref 0.0–0.2)

## 2022-07-26 LAB — SURGICAL PCR SCREEN
MRSA, PCR: NEGATIVE
Staphylococcus aureus: POSITIVE — AB

## 2022-07-26 NOTE — Progress Notes (Addendum)
Perioperative Services Pre-Admission/Anesthesia Testing    Date: 07/26/22  Name: Becky Gordon MRN:   [id number]  Re: Abnormal preoperative ECG  Planned Surgical Procedure(s):    Case: [id number] Date/Time: 08/07/22 1106   Procedure: L3-5 DECOMPRESSION   Anesthesia type: General   Pre-op diagnosis: M48.062 - Neurogenic claudication due to lumbar spinal stenosis   Location: [facility] OR ROOM 03 / [facility]   Surgeons: Venetia Night, MD   Clinical Notes:  Patient is scheduled for the above procedure on 08/07/2022 with Dr. Venetia Night, MD.  In preparation for the procedure, patient presented to the PAT clinic on the afternoon of 07/26/2022 for preoperative labs and ECG.  In review of her ECG, patient with a new LBBB noted.  Previous tracing was obtained on 11/01/2021 showing sinus rhythm with PVCs at a rate of 72 bpm; no ST or T wave abnormalities.  Patient denies a personal history of cardiovascular disease, however she has a strong family history of CAD, MI, and cardiac arrhythmia.  In review of her available medical records, it appears as if patient was seen by cardiology Mariah Milling, MD) back in 2016, at which time she was experiencing exertional dyspnea and discomfort in her jaw/face with walking.  Patient verbalized concerns related to her familial history of CAD.  Patient was sent for coronary CTA, which revealed a coronary calcium score of 5 (low), which places patient in the 46th percentile for age, sex, race matched control.  Patient with several significant cardiovascular risk factors in place, including:  Age Sex Obesity (BMI 33.24 kg/m) 40+ year smoking history (quit in 1988) HTN Untreated HLD.  Had issues with Lipitor.  Previously on fish oil, however appears to have also been discontinued. T2DM Stress/anxiety and concurrent depression: recently lost son in a house fire (01/2022) Aforementioned family history: mother suffered major MI at age 70,  sister with CAD, sister suffered lethal cardiac arrhythmia and died  Patient denies the use of ETOH or the use of illegal substances.  Patient denies any significant cardiovascular symptoms at this time.  She still continues to experience exertional dyspnea, however denies chest pain.  Patient is able to complete all of her ADLs/IADLs without cardiovascular limitation.  Functional capacity is somewhat limited by her age and back pain. She has not experienced any recent episodes of nausea, vomiting, or diaphoresis.  Patient denies experiencing any pain in her neck, back, jaw, subscapular area, or LEFT upper extremity.  ECG:    Impression and Plan:  Becky Gordon found to have an abnormal preoperative ECG on 07/26/2022.  Tracing showed a new LBBB.  Patient is scheduled to undergo back surgery in the near future.  Prior to proceeding with surgery, recommending that patient be seen in consult by cardiology for further evaluation of new ECG finding.  Patient will need subsequent clearance from cardiology service prior to proceeding.  This has been discussed with patient during her time in the office.  She is aware that we will be sending a referral to [facility] today.  She was advised to expect a phone call from them regarding scheduling an appointment.  Reached out to primary attending surgeons office to make them aware of ECG finding and need for cardiovascular evaluation and clearance prior to surgery.  Copy of note being forwarded to attending surgeon for review.  No changes are being made to the OR schedule at this time.  Patient is aware that during her appointment with cardiology, further noninvasive  evaluation may be necessary, which in turn could necessitate her surgery date needed to be moved; verbalized understanding.  Encounter diagnoses: Pre-operative cardiovascular examination Abnormal EKG - Plan: Ambulatory referral to Cardiology New onset left bundle branch block  (LBBB) - Plan: Ambulatory referral to Cardiology  Quentin Mulling, MSN, APRN, FNP-C, CEN [facility]  Peri-operative Services Nurse Practitioner Phone: [phone] 07/26/22 3:14 PM  NOTE: This note has been prepared using Dragon dictation software. Despite my best ability to proofread, there is always the potential that unintentional transcriptional errors may still occur from this process.

## 2022-07-26 NOTE — Patient Instructions (Addendum)
Your procedure is scheduled on:08-07-22 Monday Report to the Registration Desk on the 1st floor of the [facility].Then proceed to the 2nd floor Surgery Desk To find out your arrival time, please call [phone] between 1PM - 3PM on:08-04-22 Friday If your arrival time is 6:00 am, do not arrive before that time as the [facility] entrance doors do not open until 6:00 am.  REMEMBER: Instructions that are not followed completely may result in serious medical risk, up to and including death; or upon the discretion of your surgeon and anesthesiologist your surgery may need to be rescheduled.  Do not eat food after midnight the night before surgery.  No gum chewing or hard candies.  You may however, drink Water up to 2 hours before you are scheduled to arrive for your surgery. Do not drink anything within 2 hours of your scheduled arrival time.  One week prior to surgery:Last dose will be on 07-30-22  Stop Anti-inflammatories (NSAIDS) such as Advil, Aleve, Ibuprofen, Motrin, Naproxen, Naprosyn and Aspirin based products such as Excedrin, Goody's Powder, BC Powder.You may however, take Tylenol if needed for pain up until the day of surgery. Stop ANY OVER THE COUNTER supplements/vitamins 7 days prior to surgery (Calcium, Biotin, Multivitamin and Super Beets)   Continue taking all prescribed medications with the exception of the following: -Stop your 81 mg Aspirin 7 days prior to surgery-Last dose will be on 07-30-22 Sunday -Stop your metFORMIN (GLUCOPHAGE) 2 days prior to surgery-Last dose will be on 08-04-22 Friday  TAKE ONLY THESE MEDICATIONS THE MORNING OF SURGERY WITH A SIP OF WATER: -Omeprazole-take one the night before and one on the morning of surgery - helps to prevent nausea after surgery.)  No Alcohol for 24 hours before or after surgery.  No Smoking including e-cigarettes for 24 hours before surgery.  No chewable tobacco products for at least 6 hours before surgery.  No nicotine  patches on the day of surgery.  Do not use any "recreational" drugs for at least a week (preferably 2 weeks) before your surgery.  Please be advised that the combination of cocaine and anesthesia may have negative outcomes, up to and including death. If you test positive for cocaine, your surgery will be cancelled.  On the morning of surgery brush your teeth with toothpaste and water, you may rinse your mouth with mouthwash if you wish. Do not swallow any toothpaste or mouthwash.  Use CHG Soap as directed on instruction sheet.  Do not wear jewelry, make-up, hairpins, clips or nail polish.  Do not wear lotions, powders, or perfumes.   Do not shave body hair from the neck down 48 hours before surgery.  Contact lenses, hearing aids and dentures may not be worn into surgery.  Do not bring valuables to the hospital. [facility] is not responsible for any missing/lost belongings or valuables.    Notify your doctor if there is any change in your medical condition (cold, fever, infection).  Wear comfortable clothing (specific to your surgery type) to the hospital.  After surgery, you can help prevent lung complications by doing breathing exercises.  Take deep breaths and cough every 1-2 hours. Your doctor may order a device called an Incentive Spirometer to help you take deep breaths. When coughing or sneezing, hold a pillow firmly against your incision with both hands. This is called "splinting." Doing this helps protect your incision. It also decreases belly discomfort.  If you are being admitted to the hospital overnight, leave your suitcase in  the car. After surgery it may be brought to your room.  In case of increased patient census, it may be necessary for you, the patient, to continue your postoperative care in the Same Day Surgery department.  If you are being discharged the day of surgery, you will not be allowed to drive home. You will need a responsible individual to drive you  home and stay with you for 24 hours after surgery.   If you are taking public transportation, you will need to have a responsible individual with you.  Please call the Pre-admissions Testing Dept. at [phone] if you have any questions about these instructions.  Surgery Visitation Policy:  Patients having surgery or a procedure may have two visitors.  Children under the age of 32 must have an adult with them who is not the patient.    Pre-operative 5 CHG Bath Instructions   You can play a key role in reducing the risk of infection after surgery. Your skin needs to be as free of germs as possible. You can reduce the number of germs on your skin by washing with CHG (chlorhexidine gluconate) soap before surgery. CHG is an antiseptic soap that kills germs and continues to kill germs even after washing.   DO NOT use if you have an allergy to chlorhexidine/CHG or antibacterial soaps. If your skin becomes reddened or irritated, stop using the CHG and notify one of our RNs at [phone].   Please shower with the CHG soap starting 4 days before surgery using the following schedule:     Please keep in mind the following:  DO NOT shave, including legs and underarms, starting the day of your first shower.   You may shave your face at any point before/day of surgery.  Place clean sheets on your bed the day you start using CHG soap. Use a clean washcloth (not used since being washed) for each shower. DO NOT sleep with pets once you start using the CHG.   CHG Shower Instructions:  If you choose to wash your hair and private area, wash first with your normal shampoo/soap.  After you use shampoo/soap, rinse your hair and body thoroughly to remove shampoo/soap residue.  Turn the water OFF and apply about 3 tablespoons (45 ml) of CHG soap to a CLEAN washcloth.  Apply CHG soap ONLY FROM YOUR NECK DOWN TO YOUR TOES (washing for 3-5 minutes)  DO NOT use CHG soap on face, private areas, open  wounds, or sores.  Pay special attention to the area where your surgery is being performed.  If you are having back surgery, having someone wash your back for you may be helpful. Wait 2 minutes after CHG soap is applied, then you may rinse off the CHG soap.  Pat dry with a clean towel  Put on clean clothes/pajamas   If you choose to wear lotion, please use ONLY the CHG-compatible lotions on the back of this paper.     Additional instructions for the day of surgery: DO NOT APPLY any lotions, deodorants, cologne, or perfumes.   Put on clean/comfortable clothes.  Brush your teeth.  Ask your nurse before applying any prescription medications to the skin.      CHG Compatible Lotions   Aveeno Moisturizing lotion  Cetaphil Moisturizing Cream  Cetaphil Moisturizing Lotion  Clairol Herbal Essence Moisturizing Lotion, Dry Skin  Clairol Herbal Essence Moisturizing Lotion, Extra Dry Skin  Clairol Herbal Essence Moisturizing Lotion, Normal Skin  Curel Age Defying Therapeutic Moisturizing Lotion  with Alpha Hydroxy  Curel Extreme Care Body Lotion  Curel Soothing Hands Moisturizing Hand Lotion  Curel Therapeutic Moisturizing Cream, Fragrance-Free  Curel Therapeutic Moisturizing Lotion, Fragrance-Free  Curel Therapeutic Moisturizing Lotion, Original Formula  Eucerin Daily Replenishing Lotion  Eucerin Dry Skin Therapy Plus Alpha Hydroxy Crme  Eucerin Dry Skin Therapy Plus Alpha Hydroxy Lotion  Eucerin Original Crme  Eucerin Original Lotion  Eucerin Plus Crme Eucerin Plus Lotion  Eucerin TriLipid Replenishing Lotion  Keri Anti-Bacterial Hand Lotion  Keri Deep Conditioning Original Lotion Dry Skin Formula Softly Scented  Keri Deep Conditioning Original Lotion, Fragrance Free Sensitive Skin Formula  Keri Lotion Fast Absorbing Fragrance Free Sensitive Skin Formula  Keri Lotion Fast Absorbing Softly Scented Dry Skin Formula  Keri Original Lotion  Keri Skin Renewal Lotion Keri Silky Smooth  Lotion  Keri Silky Smooth Sensitive Skin Lotion  Nivea Body Creamy Conditioning Oil  Nivea Body Extra Enriched Teacher, adult education Moisturizing Lotion Nivea Crme  Nivea Skin Firming Lotion  NutraDerm 30 Skin Lotion  NutraDerm Skin Lotion  NutraDerm Therapeutic Skin Cream  NutraDerm Therapeutic Skin Lotion  ProShield Protective Hand Cream  Provon moisturizing lotion

## 2022-07-28 ENCOUNTER — Encounter: Payer: Self-pay | Admitting: Cardiology

## 2022-07-28 ENCOUNTER — Ambulatory Visit: Payer: Medicare HMO | Attending: Cardiology | Admitting: Cardiology

## 2022-07-28 VITALS — BP 136/67 | HR 72 | Ht 65.5 in | Wt 202.0 lb

## 2022-07-28 DIAGNOSIS — Z01818 Encounter for other preprocedural examination: Secondary | ICD-10-CM

## 2022-07-28 DIAGNOSIS — E782 Mixed hyperlipidemia: Secondary | ICD-10-CM

## 2022-07-28 DIAGNOSIS — I447 Left bundle-branch block, unspecified: Secondary | ICD-10-CM | POA: Diagnosis not present

## 2022-07-28 DIAGNOSIS — R011 Cardiac murmur, unspecified: Secondary | ICD-10-CM | POA: Diagnosis not present

## 2022-07-28 NOTE — Patient Instructions (Addendum)
Medication Instructions:   Your physician recommends that you continue on your current medications as directed. Please refer to the Current Medication list given to you today.  *If you need a refill on your cardiac medications before your next appointment, please call your pharmacy*   Lab Work:  Your physician recommends you have fasting labs.   If you have labs (blood work) drawn today and your tests are completely normal, you will receive your results only by: MyChart Message (if you have MyChart) OR A paper copy in the mail If you have any lab test that is abnormal or we need to change your treatment, we will call you to review the results.   Testing/Procedures:  Your physician has requested that you have an echocardiogram. Echocardiography is a painless test that uses sound waves to create images of your heart. It provides your doctor with information about the size and shape of your heart and how well your heart's chambers and valves are working. This procedure takes approximately one hour. There are no restrictions for this procedure. Please do NOT wear cologne, perfume, aftershave, or lotions (deodorant is allowed). Please arrive 15 minutes prior to your appointment time.  [facility] MYOVIEW  Your Provider has ordered a Stress Test with nuclear imaging. The purpose of this test is to evaluate the blood supply to your heart muscle. This procedure is referred to as a "Non-Invasive Stress Test." This is because other than having an IV started in your vein, nothing is inserted or "invades" your body. Cardiac stress tests are done to find areas of poor blood flow to the heart by determining the extent of coronary artery disease (CAD). Some patients exercise on a treadmill, which naturally increases the blood flow to your heart, while others who are unable to walk on a treadmill due to physical limitations have a pharmacologic/chemical stress agent called Lexiscan. This medicine will mimic  walking on a treadmill by temporarily increasing your coronary blood flow.     REPORT TO [facility] MEDICAL MALL ENTRANCE  **Proceed to the 1st desk on the right, REGISTRATION, to check in**  Please note: this test may take anywhere between 2-4 hours to complete    Instructions regarding medication:   _XX__:   You may take all of your regular morning medications the day of your test unless listed below.   _XX___:  Hold other medications as follows: HCTZ    How to prepare for your Myoview test:  Do not eat or drink for 6 hours prior to the test No caffeine for 24 hours prior to the test No smoking 24 hours prior to the test. Ladies, please do not wear dresses.  Skirts or pants are appropriate. Please wear a short sleeve shirt. No perfume, cologne or lotion. Wear comfortable walking shoes. No heels!   PLEASE NOTIFY THE OFFICE AT LEAST 24 HOURS IN ADVANCE IF YOU ARE UNABLE TO KEEP YOUR APPOINTMENT.  [phone] AND  PLEASE NOTIFY NUCLEAR MEDICINE AT [facility] AT LEAST 24 HOURS IN ADVANCE IF YOU ARE UNABLE TO KEEP YOUR APPOINTMENT. [phone]      Follow-Up: At [facility], you and your health needs are our priority.  As part of our continuing mission to provide you with exceptional heart care, we have created designated Provider Care Teams.  These Care Teams include your primary Cardiologist (physician) and Advanced Practice Providers (APPs -  Physician Assistants and Nurse Practitioners) who all work together to provide you with the care you need, when you need  it.  We recommend signing up for the patient portal called "MyChart".  Sign up information is provided on this After Visit Summary.  MyChart is used to connect with patients for Virtual Visits (Telemedicine).  Patients are able to view lab/test results, encounter notes, upcoming appointments, etc.  Non-urgent messages can be sent to your provider as well.   To learn more about what you can do with MyChart, go to  [email or web address].    Your next appointment:    After testing  Provider:   You may see Debbe Odea, MD or one of the following Advanced Practice Providers on your designated Care Team:   Nicolasa Ducking, NP Eula Listen, PA-C Cadence Fransico Michael, PA-C Charlsie Quest, NP
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
## 2022-07-31 ENCOUNTER — Other Ambulatory Visit
Admission: RE | Admit: 2022-07-31 | Discharge: 2022-07-31 | Disposition: A | Discharge status: Home / Self Care | Payer: Medicare HMO | Attending: Cardiology | Admitting: Cardiology

## 2022-07-31 DIAGNOSIS — E782 Mixed hyperlipidemia: Secondary | ICD-10-CM | POA: Insufficient documentation

## 2022-07-31 DIAGNOSIS — Z01818 Encounter for other preprocedural examination: Secondary | ICD-10-CM | POA: Insufficient documentation

## 2022-07-31 LAB — LIPID PANEL
Cholesterol: 273 mg/dL — ABNORMAL HIGH (ref 0–200)
HDL: 33 mg/dL — ABNORMAL LOW (ref >40)
LDL Cholesterol: 168 mg/dL — ABNORMAL HIGH (ref 0–99)
Total CHOL/HDL Ratio: 8.3 RATIO
Triglycerides: 360 mg/dL — ABNORMAL HIGH (ref <150)
VLDL: 72 mg/dL — ABNORMAL HIGH (ref 0–40)

## 2022-08-01 ENCOUNTER — Other Ambulatory Visit: Payer: Self-pay

## 2022-08-01 ENCOUNTER — Encounter
Admission: RE | Admit: 2022-08-01 | Discharge: 2022-08-01 | Disposition: A | Discharge status: Home / Self Care | Payer: Medicare HMO | Source: Ambulatory Visit | Attending: Cardiology | Admitting: Cardiology

## 2022-08-01 DIAGNOSIS — I447 Left bundle-branch block, unspecified: Secondary | ICD-10-CM | POA: Diagnosis present

## 2022-08-01 DIAGNOSIS — Z01818 Encounter for other preprocedural examination: Secondary | ICD-10-CM

## 2022-08-01 LAB — NM MYOCAR MULTI W/SPECT W/WALL MOTION / EF
LV dias vol: 67 mL (ref 46–106)
LV sys vol: 22 mL
Nuc Stress EF: 67 %
Peak HR: 82 {beats}/min
Percent HR: 57 %
Rest HR: 62 {beats}/min
Rest Nuclear Isotope Dose: 11 mCi
SDS: 0
SRS: 16
SSS: 2
ST Depression (mm): 0 mm
Stress Nuclear Isotope Dose: 32 mCi
TID: 0.98

## 2022-08-01 MED ORDER — ROSUVASTATIN CALCIUM 20 MG PO TABS: 20.0000 mg | ORAL_TABLET | Freq: Every day | ORAL | 3 refills | Status: DC

## 2022-08-01 MED ORDER — TECHNETIUM TC 99M TETROFOSMIN IV KIT
32.0000 | PACK | Freq: Once | INTRAVENOUS | Status: AC | PRN
  Administered 2022-08-01: 32 via INTRAVENOUS

## 2022-08-01 MED ORDER — REGADENOSON 0.4 MG/5ML IV SOLN
0.4000 mg | Freq: Once | INTRAVENOUS | Status: AC
  Administered 2022-08-01: 0.4 mg via INTRAVENOUS

## 2022-08-01 MED ORDER — TECHNETIUM TC 99M TETROFOSMIN IV KIT
10.0000 | PACK | Freq: Once | INTRAVENOUS | Status: AC | PRN
  Administered 2022-08-01: 10.95 via INTRAVENOUS

## 2022-08-02 ENCOUNTER — Ambulatory Visit: Payer: Medicare HMO | Attending: Cardiology

## 2022-08-02 DIAGNOSIS — Z01818 Encounter for other preprocedural examination: Secondary | ICD-10-CM

## 2022-08-02 DIAGNOSIS — Z01810 Encounter for preprocedural cardiovascular examination: Secondary | ICD-10-CM | POA: Diagnosis not present

## 2022-08-02 LAB — ECHOCARDIOGRAM COMPLETE
Area-P 1/2: 3.21 cm2
S' Lateral: 3.2 cm

## 2022-08-03 ENCOUNTER — Encounter: Payer: Self-pay | Admitting: Urgent Care

## 2022-08-06 MED ORDER — CHLORHEXIDINE GLUCONATE 0.12 % MT SOLN
15.0000 mL | Freq: Once | OROMUCOSAL | Status: AC
  Administered 2022-08-07: 15 mL via OROMUCOSAL

## 2022-08-06 MED ORDER — ORAL CARE MOUTH RINSE: 15.0000 mL | Freq: Once | OROMUCOSAL | Status: AC

## 2022-08-06 MED ORDER — CEFAZOLIN IN SODIUM CHLORIDE 2-0.9 GM/100ML-% IV SOLN
2.0000 g | Freq: Once | INTRAVENOUS | Status: DC
  Filled 2022-08-06: qty 100

## 2022-08-06 MED ORDER — CEFAZOLIN SODIUM-DEXTROSE 2-4 GM/100ML-% IV SOLN
2.0000 g | INTRAVENOUS | Status: AC
  Administered 2022-08-07: 2 g via INTRAVENOUS

## 2022-08-06 MED ORDER — SODIUM CHLORIDE 0.9 % IV SOLN: INTRAVENOUS | Status: DC

## 2022-08-07 ENCOUNTER — Ambulatory Visit: Payer: Medicare HMO

## 2022-08-07 ENCOUNTER — Ambulatory Visit: Payer: Medicare HMO | Admitting: Certified Registered"

## 2022-08-07 ENCOUNTER — Encounter: Payer: Self-pay | Admitting: Neurosurgery

## 2022-08-07 ENCOUNTER — Other Ambulatory Visit: Payer: Self-pay

## 2022-08-07 ENCOUNTER — Encounter
Admission: RE | Disposition: A | Discharge status: Home / Self Care | Payer: Self-pay | Source: Home / Self Care | Attending: Neurosurgery

## 2022-08-07 ENCOUNTER — Ambulatory Visit: Payer: Medicare HMO | Admitting: Urgent Care

## 2022-08-07 ENCOUNTER — Ambulatory Visit
Admission: RE | Admit: 2022-08-07 | Discharge: 2022-08-07 | Disposition: A | Discharge status: Home / Self Care | Payer: Medicare HMO | Attending: Neurosurgery | Admitting: Neurosurgery

## 2022-08-07 DIAGNOSIS — Z87891 Personal history of nicotine dependence: Secondary | ICD-10-CM | POA: Insufficient documentation

## 2022-08-07 DIAGNOSIS — F419 Anxiety disorder, unspecified: Secondary | ICD-10-CM | POA: Diagnosis not present

## 2022-08-07 DIAGNOSIS — Z7984 Long term (current) use of oral hypoglycemic drugs: Secondary | ICD-10-CM | POA: Insufficient documentation

## 2022-08-07 DIAGNOSIS — I10 Essential (primary) hypertension: Secondary | ICD-10-CM | POA: Diagnosis not present

## 2022-08-07 DIAGNOSIS — M47816 Spondylosis without myelopathy or radiculopathy, lumbar region: Secondary | ICD-10-CM | POA: Insufficient documentation

## 2022-08-07 DIAGNOSIS — I447 Left bundle-branch block, unspecified: Secondary | ICD-10-CM | POA: Insufficient documentation

## 2022-08-07 DIAGNOSIS — K219 Gastro-esophageal reflux disease without esophagitis: Secondary | ICD-10-CM | POA: Diagnosis not present

## 2022-08-07 DIAGNOSIS — F32A Depression, unspecified: Secondary | ICD-10-CM | POA: Insufficient documentation

## 2022-08-07 DIAGNOSIS — M48062 Spinal stenosis, lumbar region with neurogenic claudication: Secondary | ICD-10-CM | POA: Diagnosis present

## 2022-08-07 DIAGNOSIS — E119 Type 2 diabetes mellitus without complications: Secondary | ICD-10-CM | POA: Insufficient documentation

## 2022-08-07 DIAGNOSIS — Z79899 Other long term (current) drug therapy: Secondary | ICD-10-CM | POA: Insufficient documentation

## 2022-08-07 DIAGNOSIS — Z09 Encounter for follow-up examination after completed treatment for conditions other than malignant neoplasm: Secondary | ICD-10-CM | POA: Insufficient documentation

## 2022-08-07 DIAGNOSIS — Z01818 Encounter for other preprocedural examination: Secondary | ICD-10-CM

## 2022-08-07 HISTORY — PX: LUMBAR LAMINECTOMY/DECOMPRESSION MICRODISCECTOMY: SHX5026

## 2022-08-07 LAB — GLUCOSE, CAPILLARY
Glucose-Capillary: 127 mg/dL — ABNORMAL HIGH (ref 70–99)
Glucose-Capillary: 149 mg/dL — ABNORMAL HIGH (ref 70–99)

## 2022-08-07 SURGERY — LUMBAR LAMINECTOMY/DECOMPRESSION MICRODISCECTOMY 2 LEVELS
Anesthesia: General | Site: Spine Lumbar

## 2022-08-07 MED ORDER — 0.9 % SODIUM CHLORIDE (POUR BTL) OPTIME
TOPICAL | Status: DC | PRN
  Administered 2022-08-07: 500 mL

## 2022-08-07 MED ORDER — SODIUM CHLORIDE (PF) 0.9 % IJ SOLN
INTRAMUSCULAR | Status: DC | PRN
  Administered 2022-08-07: 59.2 mL

## 2022-08-07 MED ORDER — KETAMINE HCL 10 MG/ML IJ SOLN
INTRAMUSCULAR | Status: DC | PRN
  Administered 2022-08-07: 30 mg via INTRAVENOUS
  Administered 2022-08-07: 20 mg via INTRAVENOUS

## 2022-08-07 MED ORDER — DEXAMETHASONE SODIUM PHOSPHATE 10 MG/ML IJ SOLN
INTRAMUSCULAR | Status: DC | PRN
  Administered 2022-08-07: 10 mg via INTRAVENOUS

## 2022-08-07 MED ORDER — LIDOCAINE HCL (CARDIAC) PF 100 MG/5ML IV SOSY
PREFILLED_SYRINGE | INTRAVENOUS | Status: DC | PRN
  Administered 2022-08-07: 80 mg via INTRAVENOUS

## 2022-08-07 MED ORDER — DEXAMETHASONE SODIUM PHOSPHATE 10 MG/ML IJ SOLN
INTRAMUSCULAR | Status: AC
  Filled 2022-08-07: qty 1

## 2022-08-07 MED ORDER — EPHEDRINE 5 MG/ML INJ
INTRAVENOUS | Status: AC
  Filled 2022-08-07: qty 5

## 2022-08-07 MED ORDER — FENTANYL CITRATE (PF) 100 MCG/2ML IJ SOLN
INTRAMUSCULAR | Status: AC
  Filled 2022-08-07: qty 2

## 2022-08-07 MED ORDER — BUPIVACAINE-EPINEPHRINE (PF) 0.5% -1:200000 IJ SOLN
INTRAMUSCULAR | Status: DC | PRN
  Administered 2022-08-07: 7 mL via PERINEURAL

## 2022-08-07 MED ORDER — ONDANSETRON HCL 4 MG/2ML IJ SOLN
INTRAMUSCULAR | Status: AC
  Filled 2022-08-07: qty 2

## 2022-08-07 MED ORDER — EPINEPHRINE PF 1 MG/ML IJ SOLN
INTRAMUSCULAR | Status: AC
  Filled 2022-08-07: qty 1

## 2022-08-07 MED ORDER — ONDANSETRON HCL 4 MG/2ML IJ SOLN
4.0000 mg | Freq: Once | INTRAMUSCULAR | Status: AC | PRN
  Administered 2022-08-07: 4 mg via INTRAVENOUS

## 2022-08-07 MED ORDER — FENTANYL CITRATE (PF) 100 MCG/2ML IJ SOLN
INTRAMUSCULAR | Status: DC | PRN
  Administered 2022-08-07 (×2): 50 ug via INTRAVENOUS

## 2022-08-07 MED ORDER — METHYLPREDNISOLONE ACETATE 40 MG/ML IJ SUSP
INTRAMUSCULAR | Status: AC
  Filled 2022-08-07: qty 1

## 2022-08-07 MED ORDER — BUPIVACAINE LIPOSOME 1.3 % IJ SUSP
INTRAMUSCULAR | Status: AC
  Filled 2022-08-07: qty 20

## 2022-08-07 MED ORDER — PROPOFOL 10 MG/ML IV BOLUS
INTRAVENOUS | Status: DC | PRN
  Administered 2022-08-07: 140 mg via INTRAVENOUS
  Administered 2022-08-07: 60 mg via INTRAVENOUS

## 2022-08-07 MED ORDER — KETAMINE HCL 50 MG/5ML IJ SOSY
PREFILLED_SYRINGE | INTRAMUSCULAR | Status: AC
  Filled 2022-08-07: qty 5

## 2022-08-07 MED ORDER — LIDOCAINE HCL (PF) 2 % IJ SOLN
INTRAMUSCULAR | Status: AC
  Filled 2022-08-07: qty 5

## 2022-08-07 MED ORDER — METHOCARBAMOL 500 MG PO TABS: 500.0000 mg | ORAL_TABLET | Freq: Four times a day (QID) | ORAL | 0 refills | Status: DC | PRN

## 2022-08-07 MED ORDER — ACETAMINOPHEN 10 MG/ML IV SOLN
INTRAVENOUS | Status: AC
  Filled 2022-08-07: qty 100

## 2022-08-07 MED ORDER — OXYCODONE HCL 5 MG/5ML PO SOLN: 5.0000 mg | Freq: Once | ORAL | Status: DC | PRN

## 2022-08-07 MED ORDER — OXYCODONE HCL 5 MG PO TABS: 5.0000 mg | ORAL_TABLET | Freq: Four times a day (QID) | ORAL | 0 refills | Status: DC | PRN

## 2022-08-07 MED ORDER — HYDROMORPHONE HCL 1 MG/ML IJ SOLN
INTRAMUSCULAR | Status: AC
  Filled 2022-08-07: qty 1

## 2022-08-07 MED ORDER — SUCCINYLCHOLINE CHLORIDE 200 MG/10ML IV SOSY
PREFILLED_SYRINGE | INTRAVENOUS | Status: AC
  Filled 2022-08-07: qty 10

## 2022-08-07 MED ORDER — SUCCINYLCHOLINE CHLORIDE 200 MG/10ML IV SOSY
PREFILLED_SYRINGE | INTRAVENOUS | Status: DC | PRN
  Administered 2022-08-07: 100 mg via INTRAVENOUS

## 2022-08-07 MED ORDER — HYDROMORPHONE HCL 1 MG/ML IJ SOLN
INTRAMUSCULAR | Status: DC | PRN
  Administered 2022-08-07 (×2): .5 mg via INTRAVENOUS

## 2022-08-07 MED ORDER — ACETAMINOPHEN 10 MG/ML IV SOLN
INTRAVENOUS | Status: DC | PRN
  Administered 2022-08-07: 1000 mg via INTRAVENOUS

## 2022-08-07 MED ORDER — EPHEDRINE SULFATE (PRESSORS) 50 MG/ML IJ SOLN
INTRAMUSCULAR | Status: DC | PRN
  Administered 2022-08-07: 10 mg via INTRAVENOUS

## 2022-08-07 MED ORDER — CHLORHEXIDINE GLUCONATE 0.12 % MT SOLN
OROMUCOSAL | Status: AC
  Filled 2022-08-07: qty 15

## 2022-08-07 MED ORDER — DEXMEDETOMIDINE HCL IN NACL 80 MCG/20ML IV SOLN
INTRAVENOUS | Status: AC
  Filled 2022-08-07: qty 20

## 2022-08-07 MED ORDER — OXYCODONE HCL 5 MG PO TABS: 5.0000 mg | ORAL_TABLET | Freq: Once | ORAL | Status: DC | PRN

## 2022-08-07 MED ORDER — DROPERIDOL 2.5 MG/ML IJ SOLN
0.6250 mg | Freq: Once | INTRAMUSCULAR | Status: AC
  Administered 2022-08-07: 0.625 mg via INTRAVENOUS
  Filled 2022-08-07: qty 2

## 2022-08-07 MED ORDER — DEXMEDETOMIDINE HCL IN NACL 200 MCG/50ML IV SOLN
INTRAVENOUS | Status: DC | PRN
  Administered 2022-08-07: 8 ug via INTRAVENOUS
  Administered 2022-08-07: 4 ug via INTRAVENOUS
  Administered 2022-08-07: 8 ug via INTRAVENOUS

## 2022-08-07 MED ORDER — ACETAMINOPHEN 10 MG/ML IV SOLN: 1000.0000 mg | Freq: Once | INTRAVENOUS | Status: DC | PRN

## 2022-08-07 MED ORDER — FENTANYL CITRATE (PF) 100 MCG/2ML IJ SOLN: 25.0000 ug | INTRAMUSCULAR | Status: DC | PRN

## 2022-08-07 MED ORDER — PROPOFOL 10 MG/ML IV BOLUS
INTRAVENOUS | Status: AC
  Filled 2022-08-07: qty 20

## 2022-08-07 MED ORDER — BUPIVACAINE HCL (PF) 0.5 % IJ SOLN
INTRAMUSCULAR | Status: AC
  Filled 2022-08-07: qty 30

## 2022-08-07 MED ORDER — DROPERIDOL 2.5 MG/ML IJ SOLN
INTRAMUSCULAR | Status: AC
  Filled 2022-08-07: qty 2

## 2022-08-07 MED ORDER — SODIUM CHLORIDE FLUSH 0.9 % IV SOLN
INTRAVENOUS | Status: AC
  Filled 2022-08-07: qty 20

## 2022-08-07 MED ORDER — ONDANSETRON HCL 4 MG/2ML IJ SOLN
INTRAMUSCULAR | Status: DC | PRN
  Administered 2022-08-07: 4 mg via INTRAVENOUS

## 2022-08-07 MED ORDER — CEFAZOLIN SODIUM-DEXTROSE 2-4 GM/100ML-% IV SOLN
INTRAVENOUS | Status: AC
  Filled 2022-08-07: qty 100

## 2022-08-07 MED ORDER — SURGIFLO WITH THROMBIN (HEMOSTATIC MATRIX KIT) OPTIME
TOPICAL | Status: DC | PRN
  Administered 2022-08-07: 1 via TOPICAL

## 2022-08-07 SURGICAL SUPPLY — 43 items
ADH SKN CLS APL DERMABOND .7 (GAUZE/BANDAGES/DRESSINGS) ×1
AGENT HMST KT MTR STRL THRMB (HEMOSTASIS) ×1
BASIN KIT SINGLE STR (MISCELLANEOUS) ×1 IMPLANT
BUR NEURO DRILL SOFT 3.0X3.8M (BURR) ×1 IMPLANT
CNTNR URN SCR LID CUP LEK RST (MISCELLANEOUS) ×1 IMPLANT
CONT SPEC 4OZ STRL OR WHT (MISCELLANEOUS) ×1
DERMABOND ADVANCED .7 DNX12 (GAUZE/BANDAGES/DRESSINGS) ×1 IMPLANT
DRAPE C ARM PK CFD 31 SPINE (DRAPES) ×1 IMPLANT
DRAPE LAPAROTOMY 100X77 ABD (DRAPES) ×1 IMPLANT
DRAPE MICROSCOPE SPINE 48X150 (DRAPES) IMPLANT
DRSG OPSITE POSTOP 3X4 (GAUZE/BANDAGES/DRESSINGS) IMPLANT
ELECT EZSTD 165MM 6.5IN (MISCELLANEOUS) ×1
ELECT REM PT RETURN 9FT ADLT (ELECTROSURGICAL) ×1
ELECTRODE EZSTD 165MM 6.5IN (MISCELLANEOUS) ×1 IMPLANT
ELECTRODE REM PT RTRN 9FT ADLT (ELECTROSURGICAL) ×1 IMPLANT
GLOVE BIOGEL PI IND STRL 6.5 (GLOVE) ×1 IMPLANT
GLOVE SURG SYN 6.5 ES PF (GLOVE) ×1 IMPLANT
GLOVE SURG SYN 6.5 PF PI (GLOVE) ×1 IMPLANT
GLOVE SURG SYN 8.5 E (GLOVE) ×3 IMPLANT
GLOVE SURG SYN 8.5 PF PI (GLOVE) ×3 IMPLANT
GOWN SRG LRG LVL 4 IMPRV REINF (GOWNS) ×1 IMPLANT
GOWN SRG XL LVL 3 NONREINFORCE (GOWNS) ×1 IMPLANT
GOWN STRL NON-REIN TWL XL LVL3 (GOWNS) ×1
GOWN STRL REIN LRG LVL4 (GOWNS) ×1
GRAFT DURAGEN MATRIX 1WX1L (Tissue) IMPLANT
KIT SPINAL PRONEVIEW (KITS) ×1 IMPLANT
MANIFOLD NEPTUNE II (INSTRUMENTS) ×1 IMPLANT
MARKER SKIN DUAL TIP RULER LAB (MISCELLANEOUS) ×1 IMPLANT
NDL SAFETY ECLIP 18X1.5 (MISCELLANEOUS) ×1 IMPLANT
NS IRRIG 1000ML POUR BTL (IV SOLUTION) ×1 IMPLANT
NS IRRIG 500ML POUR BTL (IV SOLUTION) IMPLANT
PACK LAMINECTOMY ARMC (PACKS) ×1 IMPLANT
SURGIFLO W/THROMBIN 8M KIT (HEMOSTASIS) ×1 IMPLANT
SUT DVC VLOC 3-0 CL 6 P-12 (SUTURE) ×1 IMPLANT
SUT ETHILON 3-0 FS-10 30 BLK (SUTURE) ×1
SUT VIC AB 0 CT1 27 (SUTURE) ×2
SUT VIC AB 0 CT1 27XCR 8 STRN (SUTURE) ×1 IMPLANT
SUT VIC AB 2-0 CT1 18 (SUTURE) ×1 IMPLANT
SUTURE EHLN 3-0 FS-10 30 BLK (SUTURE) IMPLANT
SYR 30ML LL (SYRINGE) ×2 IMPLANT
SYR 3ML LL SCALE MARK (SYRINGE) ×1 IMPLANT
TRAP FLUID SMOKE EVACUATOR (MISCELLANEOUS) ×1 IMPLANT
WATER STERILE IRR 1000ML POUR (IV SOLUTION) ×2 IMPLANT

## 2022-08-07 NOTE — Transfer of Care (Signed)
Immediate Anesthesia Transfer of Care Note  Patient: Becky Gordon  Procedure(s) Performed: L3-5 DECOMPRESSION (Spine Lumbar)  Patient Location: PACU  Anesthesia Type:General  Level of Consciousness: drowsy  Airway & Oxygen Therapy: Patient Spontanous Breathing and Patient connected to face mask oxygen  Post-op Assessment: Report given to RN, Post -op Vital signs reviewed and stable, and Patient moving all extremities  Post vital signs: Reviewed and stable  Last Vitals:  Vitals Value Taken Time  BP 122/56 08/07/22 1309  Temp 36.7 C 08/07/22 1309  Pulse 68 08/07/22 1312  Resp 8 08/07/22 1312  SpO2 99 % 08/07/22 1312  Vitals shown include unvalidated device data.  Last Pain:  Vitals:   08/07/22 1309  PainSc: Asleep         Complications: No notable events documented.

## 2022-08-07 NOTE — Discharge Summary (Signed)
Physician Discharge Summary  Patient ID: Becky Gordon MRN: [id number] DOB/AGE: [birth year] 77 y.o.  Admit date: 08/07/2022 Discharge date: 08/07/2022  Admission Diagnoses: neurogenic claudication  Discharge Diagnoses:  Active Problems:   * No active hospital problems. *   Discharged Condition: good  Hospital Course: Mrs. Becky Gordon was admitted for surgical intervention to the operating room.  She was able to be discharged after meeting criteria.  Consults: None  Significant Diagnostic Studies: radiology: X-Ray: localization at L3-5  Treatments: surgery: L3-5 decompression  Discharge Exam: Blood pressure 138/67, pulse 66, temperature 98 F (36.7 C), resp. rate 16, SpO2 99 %. General appearance: alert and cooperative CNI MAEW  Disposition: Discharge disposition: 01-Home or Self Care       Discharge Instructions     Discharge patient   Complete by: As directed    Discharge disposition: 01-Home or Self Care   Discharge patient date: 08/07/2022      Allergies as of 08/07/2022       Reactions   Celecoxib    Etodolac    Gabapentin Other (See Comments)   Made her lethargic   Prednisone Hives        Medication List     STOP taking these medications    OVER THE COUNTER MEDICATION   rosuvastatin 20 MG tablet Commonly known as: CRESTOR       TAKE these medications    acetaminophen 500 MG tablet Commonly known as: TYLENOL Take 500 mg by mouth every 8 (eight) hours as needed.   ARTHRITIS STRENGTH BC POWDER PO Take 1 Dose by mouth as needed.   aspirin EC 81 MG tablet 1 tablet daily.   Biotin 10 MG Tabs Take 1 tablet by mouth daily.   calcium carbonate 600 MG Tabs tablet Commonly known as: OS-CAL Take 1 tablet by mouth daily.   hydrochlorothiazide 12.5 MG capsule Commonly known as: MICROZIDE Take 12.5 mg by mouth every morning.   metFORMIN 500 MG tablet Commonly known as: GLUCOPHAGE Take 500 mg by mouth 2 (two) times daily with a meal.    methocarbamol 500 MG tablet Commonly known as: ROBAXIN Take 1 tablet (500 mg total) by mouth every 6 (six) hours as needed for muscle spasms.   MULTIPLE VITAMIN PO Take 1 tablet by mouth daily.   Omeprazole 20 MG Tbec Take 1 tablet by mouth every morning.   oxyCODONE 5 MG immediate release tablet Commonly known as: Roxicodone Take 1 tablet (5 mg total) by mouth every 6 (six) hours as needed for up to 7 days.        Follow-up Information     Drake Leach, PA-C Follow up on 08/21/2022.   Specialty: Neurosurgery Why: 0830 Contact information: [address] [place] [address] [phone]                 Signed: Venetia Night 08/07/2022, 1:47 PM

## 2022-08-07 NOTE — Interval H&P Note (Signed)
History and Physical Interval Note:  08/07/2022 9:45 AM  Becky Gordon  has presented today for surgery, with the diagnosis of M48.062 - Neurogenic claudication due to lumbar spinal stenosis.  The various methods of treatment have been discussed with the patient and family. After consideration of risks, benefits and other options for treatment, the patient has consented to  Procedure(s): L3-5 DECOMPRESSION (N/A) as a surgical intervention.  The patient's history has been reviewed, patient examined, no change in status, stable for surgery.  I have reviewed the patient's chart and labs.  Questions were answered to the patient's satisfaction.    Heart sounds normal no MRG. Chest Clear to Auscultation Bilaterally.   [name]
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
## 2022-08-07 NOTE — Discharge Instructions (Addendum)
Your surgeon has performed an operation on your lumbar spine (low back) to relieve pressure on one or more nerves. Many times, patients feel better immediately after surgery and can "overdo it." Even if you feel well, it is important that you follow these activity guidelines. If you do not let your back heal properly from the surgery, you can increase the chance of a disc herniation and/or return of your symptoms. The following are instructions to help in your recovery once you have been discharged from the hospital.  * It is ok to take NSAIDs after surgery.  Activity    No bending, lifting, or twisting ("BLT"). Avoid lifting objects heavier than 10 pounds (gallon milk jug).  Where possible, avoid household activities that involve lifting, bending, pushing, or pulling such as laundry, vacuuming, grocery shopping, and childcare. Try to arrange for help from friends and family for these activities while your back heals.  Increase physical activity slowly as tolerated.  Taking short walks is encouraged, but avoid strenuous exercise. Do not jog, run, bicycle, lift weights, or participate in any other exercises unless specifically allowed by your doctor. Avoid prolonged sitting, including car rides.  Talk to your doctor before resuming sexual activity.  You should not drive until cleared by your doctor.  Until released by your doctor, you should not return to work or school.  You should rest at home and let your body heal.   You may shower three days after your surgery.  After showering, lightly dab your incision dry. Do not take a tub bath or go swimming for 3 weeks, or until approved by your doctor at your follow-up appointment.  If you smoke, we strongly recommend that you quit.  Smoking has been proven to interfere with normal healing in your back and will dramatically reduce the success rate of your surgery. Please contact [facility] (800-QUIT-NOW) and use the resources at www.[facility].com for  assistance in stopping smoking.  Surgical Incision   If you have a dressing on your incision, you may remove it three days after your surgery. Keep your incision area clean and dry.  If you have staples or stitches on your incision, you should have a follow up scheduled for removal. If you do not have staples or stitches, you will have steri-strips (small pieces of surgical tape) or Dermabond glue. The steri-strips/glue should begin to peel away within about a week (it is fine if the steri-strips fall off before then). If the strips are still in place one week after your surgery, you may gently remove them.  Diet            You may return to your usual diet. Be sure to stay hydrated.  When to Contact us  Although your surgery and recovery will likely be uneventful, you may have some residual numbness, aches, and pains in your back and/or legs. This is normal and should improve in the next few weeks.  However, should you experience any of the following, contact us immediately: New numbness or weakness Pain that is progressively getting worse, and is not relieved by your pain medications or rest Bleeding, redness, swelling, pain, or drainage from surgical incision Chills or flu-like symptoms Fever greater than 101.0 F (38.3 C) Problems with bowel or bladder functions Difficulty breathing or shortness of breath Warmth, tenderness, or swelling in your calf  Contact Information How to contact us:  If you have any questions/concerns before or after surgery, you can reach [place] at [phone], or you can  send a [employer]. We can be reached by phone or mychart 8am-4pm, Monday-Friday.  *Please note: Calls after 4pm are forwarded to a third party answering service. Mychart messages are not routinely monitored during evenings, weekends, and holidays. Please call our office to contact the answering service for urgent concerns during non-business hours.  AMBULATORY SURGERY  DISCHARGE  INSTRUCTIONS   The drugs that you were given will stay in your system until tomorrow so for the next 24 hours you should not:  Drive an automobile Make any legal decisions Drink any alcoholic beverage   You may resume regular meals tomorrow.  Today it is better to start with liquids and gradually work up to solid foods.  You may eat anything you prefer, but it is better to start with liquids, then soup and crackers, and gradually work up to solid foods.   Please notify your doctor immediately if you have any unusual bleeding, trouble breathing, redness and pain at the surgery site, drainage, fever, or pain not relieved by medication.     Your post-operative visit with Dr.                                       is: Date:                        Time:    Please call to schedule your post-operative visit.  Additional Instructions:

## 2022-08-07 NOTE — Op Note (Signed)
Indications: Ms. 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 The patient tried and failed conservative management, prompting surgical intervention.  Findings: severe stenosis  Preoperative Diagnosis: Lumbar Stenosis with neurogenic claudication Postoperative Diagnosis: same   EBL: 15 ml IVF: see AR ml Drains: none Disposition: Extubated and Stable to PACU Complications: none  No foley catheter was placed.   Preoperative Note:  Risks of surgery discussed include: infection, bleeding, stroke, coma, death, paralysis, CSF leak, nerve/spinal cord injury, numbness, tingling, weakness, complex regional pain syndrome, recurrent stenosis and/or disc herniation, vascular injury, development of instability, neck/back pain, need for further surgery, persistent symptoms, development of deformity, and the risks of anesthesia. The patient understood these risks and agreed to proceed.  Operative Note:   1. L3-5 lumbar decompression including central laminectomy and bilateral medial facetectomies including foraminotomies  The patient was then brought from the preoperative center with intravenous access established.  The patient underwent general anesthesia and endotracheal tube intubation, and was then rotated on the [place] rail top where all pressure points were appropriately padded.  The skin was then thoroughly cleansed.  Perioperative antibiotic prophylaxis was administered.  Sterile prep and drapes were then applied and a timeout was then observed.  C-arm was brought into the field under sterile conditions and under lateral visualization the L4-5 interspace was identified and marked.  The incision was marked on the right and injected with local anesthetic. Once this was complete a 3 cm incision was opened with the use of a #10 blade knife.    The metrx tubes were sequentially advanced and confirmed in position at L4-5. An 18mm by 70mm tube was  locked in place to the bed side attachment.  The microscope was then sterilely brought into the field and muscle creep was hemostased with a bipolar and resected with a pituitary rongeur.  A Bovie extender was then used to expose the spinous process and lamina.  Careful attention was placed to not violate the facet capsule. A 3 mm matchstick drill bit was then used to make a hemi-laminotomy trough until the ligamentum flavum was exposed.  This was extended to the base of the spinous process and to the contralateral side to remove all the central bone from each side.  Once this was complete and the underlying ligamentum flavum was visualized, it was dissected with a curette and resected with Kerrison rongeurs.  Extensive ligamentum hypertrophy was noted, requiring a substantial amount of time and care for removal.  The dura was identified and palpated. The kerrison rongeur was then used to remove the medial facet bilaterally until no compression was noted.  A balltip probe was used to confirm decompression of the ipsilateral L5 nerve root.  Additional attention was paid to completion of the contralateral L4-5 foraminotomy until the contralateral traversing nerve root was completely free.  Once this was complete, L4-5 central decompression including medial facetectomy and foraminotomy was confirmed and decompression on both sides was confirmed. A pinhole durotomy was noted next to a spike of bone.  This was covered with duragen with no active CSF leak noted.  The wound was copiously irrigated. The tube system was then removed under microscopic visualization and hemostasis was obtained with a bipolar.    After performing the decompression at L4-5, the metrx tubes were sequentially advanced and confirmed in position at L3-4. An 18mm by 70mm tube was locked in place to the bed side attachment.  Fluoroscopy was then removed from the field.  The microscope  was then sterilely brought into the field and muscle creep  was hemostased with a bipolar and resected with a pituitary rongeur.  A Bovie extender was then used to expose the spinous process and lamina.  Careful attention was placed to not violate the facet capsule. A 3 mm matchstick drill bit was then used to make a hemi-laminotomy trough until the ligamentum flavum was exposed.  This was extended to the base of the spinous process and to the contralateral side to remove all the central bone from each side.  Once this was complete and the underlying ligamentum flavum was visualized, it was dissected with a curette and resected with Kerrison rongeurs.  Extensive ligamentum hypertrophy was noted, requiring a substantial amount of time and care for removal.  The dura was identified and palpated. The kerrison rongeur was then used to remove the medial facet bilaterally until no compression was noted.  A balltip probe was used to confirm decompression of the ipsilateral L4 nerve root.  Additional attention was paid to completion of the contralateral foraminotomy until the contralateral L4 nerve root was completely free.  Once this was complete, L3-4 central decompression including medial facetectomy and foraminotomy was confirmed and decompression on both sides was confirmed. No CSF leak was noted.   The wound was copiously irrigated. The tube system was then removed under microscopic visualization and hemostasis was obtained with a bipolar.     The fascial layer was reapproximated with the use of a 0 Vicryl suture.  Subcutaneous tissue layer was reapproximated using 2-0 Vicryl suture.  3-0 nylon was placed on the skin.  Patient was then rotated back to the preoperative bed awakened from anesthesia and taken to recovery all counts are correct in this case.  I performed the entire procedure.   Jamey Demchak K. Myer Haff MD

## 2022-08-07 NOTE — Anesthesia Procedure Notes (Signed)
Procedure Name: Intubation Date/Time: 08/07/2022 10:23 AM  Performed by: Katherine Basset, CRNAPre-anesthesia Checklist: Patient identified, Emergency Drugs available, Suction available and Patient being monitored Patient Re-evaluated:Patient Re-evaluated prior to induction Oxygen Delivery Method: Circle system utilized Preoxygenation: Pre-oxygenation with 100% oxygen Induction Type: IV induction Laryngoscope Size: Miller and 2 Grade View: Grade II Tube type: Oral Tube size: 6.5 mm Number of attempts: 1 Airway Equipment and Method: Stylet, Oral airway, Bite block and LTA kit utilized Placement Confirmation: ETT inserted through vocal cords under direct vision, positive ETCO2 and breath sounds checked- equal and bilateral Secured at: 20 cm Tube secured with: Tape Dental Injury: Teeth and Oropharynx as per pre-operative assessment

## 2022-08-07 NOTE — Anesthesia Postprocedure Evaluation (Signed)
Anesthesia Post Note  Patient: Becky Gordon  Procedure(s) Performed: L3-5 DECOMPRESSION (Spine Lumbar)  Patient location during evaluation: PACU Anesthesia Type: General Level of consciousness: awake and alert Pain management: pain level controlled Vital Signs Assessment: post-procedure vital signs reviewed and stable Respiratory status: spontaneous breathing, nonlabored ventilation, respiratory function stable and patient connected to nasal cannula oxygen Cardiovascular status: blood pressure returned to baseline and stable Postop Assessment: no apparent nausea or vomiting Anesthetic complications: no   No notable events documented.   Last Vitals:  Vitals:   08/07/22 1415 08/07/22 1430  BP: (!) 157/76 (!) 165/80  Pulse: 71 69  Resp: 10 14  Temp:    SpO2: 97% 97%    Last Pain:  Vitals:   08/07/22 1430  PainSc: Asleep                 Corinda Gubler

## 2022-08-08 ENCOUNTER — Encounter: Payer: Self-pay | Admitting: Neurosurgery

## 2022-08-08 ENCOUNTER — Telehealth: Payer: Self-pay | Admitting: Neurosurgery

## 2022-08-08 DIAGNOSIS — Z9889 Other specified postprocedural states: Secondary | ICD-10-CM

## 2022-08-08 DIAGNOSIS — M48062 Spinal stenosis, lumbar region with neurogenic claudication: Secondary | ICD-10-CM

## 2022-08-08 MED ORDER — OXYCODONE HCL 5 MG PO TABS
5.0000 mg | ORAL_TABLET | Freq: Four times a day (QID) | ORAL | 0 refills | Status: AC | PRN
Start: 2022-08-08 — End: 2022-08-15

## 2022-08-08 NOTE — Telephone Encounter (Signed)
L3-5 lumbar decompression including central laminectomy and bilateral medial facetectomies including foraminotomies on 08/07/22  [facility] [place]. PMP reviewed and is appropriate.   Please let her know.

## 2022-08-08 NOTE — Telephone Encounter (Signed)
Patient has been notified of Dr.Yarbrough's response. 

## 2022-08-08 NOTE — Telephone Encounter (Signed)
Dr Myer Haff said when she is walking a couple hundred feet three times a day (they are to minimize her risk of a blood clot)

## 2022-08-08 NOTE — Telephone Encounter (Signed)
How long will she have to wear the surgical hoses for?

## 2022-08-08 NOTE — Telephone Encounter (Signed)
Patient notified

## 2022-08-08 NOTE — Telephone Encounter (Signed)
I called [facility] and canceled the rx and they confirmed that [facility] [place] should [facility] some. Please resend rx to [facility] [place].

## 2022-08-08 NOTE — Telephone Encounter (Signed)
L3-5 decompression on 08/07/22  [facility] [place] [facility], can you cancel that rx and send it [facility] [place]. Her daughter lives there and can pick it up. She has been with out pain medication since she got home yesterday.

## 2022-08-10 ENCOUNTER — Ambulatory Visit: Payer: Medicare HMO | Admitting: Cardiology

## 2022-08-20 NOTE — Progress Notes (Unsigned)
   REFERRING PHYSICIAN:  Alan Mulder, Md [address] [place],  [place] [address]  DOS: 08/07/22  L3-L5 lumbar decompression including central laminectomy and bilateral medial facetectomies including foraminotomies   HISTORY OF PRESENT ILLNESS: Becky Gordon is approximately 2 weeks status post L3-L5 lumbar decompression including central laminectomy and bilateral medial facetectomies including foraminotomies. Was given oxycodone and robaxin on discharge from the hospital.   She is doing well with improvement in her preop right leg pain. She has only minimal intermittent right leg pain that is tolerable. Has occasional pain in left leg and expected soreness in her lower back.   She is not taking oxycodone or robaxin. She takes prn motrin and/or tylenol.    PHYSICAL EXAMINATION:  General: Patient is well developed, well nourished, calm, collected, and in no apparent distress.   NEUROLOGICAL:  General: In no acute distress.   Awake, alert, oriented to person, place, and time.  Pupils equal round and reactive to light.  Facial tone is symmetric.     Strength:            Side Iliopsoas Quads Hamstring PF DF EHL  R 5 5 5 5 5 5   L 5 5 5 5 5 5    Incision c/d/I, sutures intact.    ROS (Neurologic):  Negative except as noted above  IMAGING: Nothing new to review.   ASSESSMENT/PLAN:  Becky Gordon is doing well s/p above surgery. Treatment options reviewed with patient and following plan made:   - I have advised the patient to lift up to 10 pounds until 6 weeks after surgery (follow up with Dr. Myer Haff).  - Sutures removed today without complication. Reviewed wound care.  - No bending, twisting, or lifting.  - Continue on current medications including prn OTC motrin or tylenol as directed.  - Follow up as scheduled in 4 weeks and prn.   Advised to contact the office if any questions or concerns arise.  Drake Leach PA-C Department of neurosurgery

## 2022-08-21 ENCOUNTER — Encounter: Payer: Self-pay | Admitting: Orthopedic Surgery

## 2022-08-21 ENCOUNTER — Ambulatory Visit (INDEPENDENT_AMBULATORY_CARE_PROVIDER_SITE_OTHER): Payer: Medicare HMO | Admitting: Orthopedic Surgery

## 2022-08-21 VITALS — BP 140/78 | Temp 97.8°F | Ht 66.0 in | Wt 202.0 lb

## 2022-08-21 DIAGNOSIS — Z09 Encounter for follow-up examination after completed treatment for conditions other than malignant neoplasm: Secondary | ICD-10-CM

## 2022-08-21 DIAGNOSIS — M48062 Spinal stenosis, lumbar region with neurogenic claudication: Secondary | ICD-10-CM

## 2022-08-21 DIAGNOSIS — Z9889 Other specified postprocedural states: Secondary | ICD-10-CM

## 2022-08-22 ENCOUNTER — Ambulatory Visit: Payer: Medicare HMO | Admitting: Physician Assistant

## 2022-08-28 ENCOUNTER — Ambulatory Visit (INDEPENDENT_AMBULATORY_CARE_PROVIDER_SITE_OTHER): Payer: Medicare HMO

## 2022-08-28 DIAGNOSIS — M48062 Spinal stenosis, lumbar region with neurogenic claudication: Secondary | ICD-10-CM

## 2022-08-28 DIAGNOSIS — Z09 Encounter for follow-up examination after completed treatment for conditions other than malignant neoplasm: Secondary | ICD-10-CM

## 2022-08-28 DIAGNOSIS — Z9889 Other specified postprocedural states: Secondary | ICD-10-CM

## 2022-08-28 NOTE — Progress Notes (Signed)
Becky Gordon came in for concerns of a residual suture in the middle of her incision. I informed her that I believe this is just a scab that will likely fall off in the near future. We discussed incision care. I advised her to contact our office with any questions or concerns.  Sharlot Gowda, RN Neurosurgery

## 2022-08-31 LAB — LIPID PANEL
Cholesterol: 280 — AB (ref 0–200)
HDL: 37 (ref 35–70)
LDL Cholesterol: 183
LDl/HDL Ratio: 7.6
Triglycerides: 307 — AB (ref 40–160)

## 2022-08-31 LAB — CBC AND DIFFERENTIAL
HCT: 40 (ref 36–46)
HCT: 40 (ref 36–46)
Hemoglobin: 13.5 (ref 12.0–16.0)
Hemoglobin: 13.5 (ref 12.0–16.0)
WBC: 5.6

## 2022-08-31 LAB — BASIC METABOLIC PANEL
BUN: 15 (ref 4–21)
Creatinine: 1.2 — AB (ref 0.5–1.1)
Glucose: 119

## 2022-08-31 LAB — COMPREHENSIVE METABOLIC PANEL
A1c: 7.1
Albumin: 4.6 (ref 3.5–5.0)
Calcium: 9.9 (ref 8.7–10.7)
Globulin: 3.2
eGFR: 49

## 2022-08-31 LAB — HEPATIC FUNCTION PANEL
ALT: 22 U/L (ref 7–35)
ALT: 22 U/L (ref 7–35)
AST: 24 (ref 13–35)
AST: 24 (ref 13–35)
Alkaline Phosphatase: 80 (ref 25–125)
Alkaline Phosphatase: 80 (ref 25–125)

## 2022-08-31 LAB — IRON,TIBC AND FERRITIN PANEL: Iron: 84

## 2022-08-31 LAB — CBC: RBC: 4.71 (ref 3.87–5.11)

## 2022-08-31 LAB — TSH: TSH: 3.32 (ref 0.41–5.90)

## 2022-08-31 LAB — HEMOGLOBIN A1C: Hemoglobin A1C: 7.1

## 2022-09-19 ENCOUNTER — Ambulatory Visit (INDEPENDENT_AMBULATORY_CARE_PROVIDER_SITE_OTHER): Payer: Medicare HMO | Admitting: Neurosurgery

## 2022-09-19 ENCOUNTER — Encounter: Payer: Self-pay | Admitting: Neurosurgery

## 2022-09-19 VITALS — BP 134/78 | Ht 66.0 in | Wt 201.0 lb

## 2022-09-19 DIAGNOSIS — Z9889 Other specified postprocedural states: Secondary | ICD-10-CM

## 2022-09-19 DIAGNOSIS — Z09 Encounter for follow-up examination after completed treatment for conditions other than malignant neoplasm: Secondary | ICD-10-CM

## 2022-09-19 DIAGNOSIS — M48062 Spinal stenosis, lumbar region with neurogenic claudication: Secondary | ICD-10-CM

## 2022-09-19 NOTE — Progress Notes (Signed)
   REFERRING PHYSICIAN:  Alan Mulder, Md [address] [place],  [place] [address]  DOS: 08/07/22  L3-L5 lumbar decompression including central laminectomy and bilateral medial facetectomies including foraminotomies   HISTORY OF PRESENT ILLNESS: Becky Gordon is status post L3-L5 lumbar decompression including central laminectomy and bilateral medial facetectomies including foraminotomies.  She is doing very well.     PHYSICAL EXAMINATION:  General: Patient is well developed, well nourished, calm, collected, and in no apparent distress.   NEUROLOGICAL:  General: In no acute distress.   Awake, alert, oriented to person, place, and time.  Pupils equal round and reactive to light.  Facial tone is symmetric.     Strength:            Side Iliopsoas Quads Hamstring PF DF EHL  R 5 5 5 5 5 5   L 5 5 5 5 5 5    Incision c/d/I, sutures intact.    ROS (Neurologic):  Negative except as noted above  IMAGING: Nothing new to review.   ASSESSMENT/PLAN:  Becky Gordon is doing well s/p above surgery.  I am very pleased with her response to surgery.  We reviewed her activity limitations.  We will see her back in clinic in approximately 6 weeks.  Venetia Night MD Department of neurosurgery

## 2022-10-20 NOTE — Progress Notes (Unsigned)
   REFERRING PHYSICIAN:  Alan Mulder, Md [address] [place],  [place] [address]  DOS: 08/07/22  L3-L5 lumbar decompression including central laminectomy and bilateral medial facetectomies including foraminotomies   HISTORY OF PRESENT ILLNESS:   She was doing very well at her last visit.   She was moving an empty oil barrel this past weekend and she feels some increased soreness in her back. No radicular leg pain. No numbness, tingling, or weakness.  Overall, she is still doing great.    PHYSICAL EXAMINATION:  General: Patient is well developed, well nourished, calm, collected, and in no apparent distress.   NEUROLOGICAL:  General: In no acute distress.   Awake, alert, oriented to person, place, and time.  Pupils equal round and reactive to light.  Facial tone is symmetric.     Strength:            Side Iliopsoas Quads Hamstring PF DF EHL  R 5 5 5 5 5 5   L 5 5 5 5 5 5    Incision well healed   ROS (Neurologic):  Negative except as noted above  IMAGING: Nothing new to review.   ASSESSMENT/PLAN:  DYLEN BOVEN is doing well s/p above surgery. She is very pleased with her progress. Treatment options reviewed with patient and following plan made:   - She can slowly return back to activities as tolerated.  - Follow up with Dr. Myer Haff in 6 months and prn.   Advised to contact the office if any questions or concerns arise.  Drake Leach PA-C Department of neurosurgery

## 2022-10-26 ENCOUNTER — Encounter: Payer: Self-pay | Admitting: Orthopedic Surgery

## 2022-10-26 ENCOUNTER — Ambulatory Visit (INDEPENDENT_AMBULATORY_CARE_PROVIDER_SITE_OTHER): Payer: Medicare HMO | Admitting: Orthopedic Surgery

## 2022-10-26 VITALS — BP 130/72 | Temp 97.9°F | Ht 65.0 in | Wt 201.0 lb

## 2022-10-26 DIAGNOSIS — Z09 Encounter for follow-up examination after completed treatment for conditions other than malignant neoplasm: Secondary | ICD-10-CM

## 2022-10-26 DIAGNOSIS — M48062 Spinal stenosis, lumbar region with neurogenic claudication: Secondary | ICD-10-CM

## 2022-10-26 DIAGNOSIS — Z9889 Other specified postprocedural states: Secondary | ICD-10-CM

## 2022-11-03 ENCOUNTER — Ambulatory Visit
Admission: RE | Admit: 2022-11-03 | Discharge: 2022-11-03 | Disposition: A | Discharge status: Home / Self Care | Payer: Medicare HMO | Source: Ambulatory Visit | Attending: Endocrinology | Admitting: Endocrinology

## 2022-11-03 DIAGNOSIS — Z1231 Encounter for screening mammogram for malignant neoplasm of breast: Secondary | ICD-10-CM | POA: Diagnosis present

## 2022-12-27 ENCOUNTER — Ambulatory Visit (INDEPENDENT_AMBULATORY_CARE_PROVIDER_SITE_OTHER): Payer: Medicare HMO | Admitting: Nurse Practitioner

## 2022-12-27 ENCOUNTER — Encounter: Payer: Self-pay | Admitting: Nurse Practitioner

## 2022-12-27 VITALS — BP 120/88 | HR 69 | Temp 98.3°F | Ht 65.0 in | Wt 200.8 lb

## 2022-12-27 DIAGNOSIS — K209 Esophagitis, unspecified without bleeding: Secondary | ICD-10-CM

## 2022-12-27 DIAGNOSIS — E118 Type 2 diabetes mellitus with unspecified complications: Secondary | ICD-10-CM

## 2022-12-27 DIAGNOSIS — E785 Hyperlipidemia, unspecified: Secondary | ICD-10-CM | POA: Diagnosis not present

## 2022-12-27 DIAGNOSIS — I152 Hypertension secondary to endocrine disorders: Secondary | ICD-10-CM | POA: Diagnosis not present

## 2022-12-27 DIAGNOSIS — Z7984 Long term (current) use of oral hypoglycemic drugs: Secondary | ICD-10-CM

## 2022-12-27 DIAGNOSIS — E1159 Type 2 diabetes mellitus with other circulatory complications: Secondary | ICD-10-CM | POA: Diagnosis not present

## 2022-12-27 DIAGNOSIS — K2270 Barrett's esophagus without dysplasia: Secondary | ICD-10-CM | POA: Diagnosis not present

## 2022-12-27 DIAGNOSIS — T148XXA Other injury of unspecified body region, initial encounter: Secondary | ICD-10-CM

## 2022-12-27 DIAGNOSIS — Z7689 Persons encountering health services in other specified circumstances: Secondary | ICD-10-CM | POA: Insufficient documentation

## 2022-12-27 DIAGNOSIS — Z0000 Encounter for general adult medical examination without abnormal findings: Secondary | ICD-10-CM | POA: Insufficient documentation

## 2022-12-27 LAB — COMPREHENSIVE METABOLIC PANEL
ALT: 19 U/L (ref 0–35)
AST: 20 U/L (ref 0–37)
Albumin: 4.2 g/dL (ref 3.5–5.2)
Alkaline Phosphatase: 74 U/L (ref 39–117)
BUN: 14 mg/dL (ref 6–23)
CO2: 29 meq/L (ref 19–32)
Calcium: 9.6 mg/dL (ref 8.4–10.5)
Chloride: 100 meq/L (ref 96–112)
Creatinine, Ser: 0.99 mg/dL (ref 0.40–1.20)
GFR: 54.77 mL/min — ABNORMAL LOW (ref >60.00)
Glucose, Bld: 100 mg/dL — ABNORMAL HIGH (ref 70–99)
Potassium: 4.2 meq/L (ref 3.5–5.1)
Sodium: 137 meq/L (ref 135–145)
Total Bilirubin: 0.3 mg/dL (ref 0.2–1.2)
Total Protein: 7.8 g/dL (ref 6.0–8.3)

## 2022-12-27 LAB — POCT GLYCOSYLATED HEMOGLOBIN (HGB A1C): Hemoglobin A1C: 6.7 % — AB (ref 4.0–5.6)

## 2022-12-27 LAB — CBC
HCT: 40.5 % (ref 36.0–46.0)
Hemoglobin: 13.1 g/dL (ref 12.0–15.0)
MCHC: 32.3 g/dL (ref 30.0–36.0)
MCV: 86.5 fL (ref 78.0–100.0)
Platelets: 297 10*3/uL (ref 150.0–400.0)
RBC: 4.68 Mil/uL (ref 3.87–5.11)
RDW: 13.5 % (ref 11.5–15.5)
WBC: 5.9 10*3/uL (ref 4.0–10.5)

## 2022-12-27 MED ORDER — MUPIROCIN CALCIUM 2 % EX CREA: 1.0000 | TOPICAL_CREAM | Freq: Two times a day (BID) | CUTANEOUS | 0 refills | Status: DC

## 2022-12-27 NOTE — Assessment & Plan Note (Signed)
Patient currently maintained on hydrochlorothiazide.  She tolerates medication well.  Patient's blood pressure well-controlled.  Continue medication as prescribed
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
## 2022-12-27 NOTE — Assessment & Plan Note (Signed)
Currently maintained on metformin 500 mg twice daily.  A1c well-controlled today.  Update foot exam today.  Continue medications as prescribed

## 2022-12-27 NOTE — Patient Instructions (Signed)
Nice to see you today I will be in touch with the labs once I have them Follow up with me in 6 months for you physical and full panel of labs, sooner If you need me

## 2022-12-27 NOTE — Assessment & Plan Note (Signed)
Slow healing abrasion to the left eyebrow will do Bactroban 2% twice daily for a week.

## 2022-12-27 NOTE — Assessment & Plan Note (Signed)
History of the same.  Patient tried a statin in the past with aches and pains.  She declines doing any further medication management at this juncture

## 2022-12-27 NOTE — Assessment & Plan Note (Signed)
Patient brought in most recent labs and office note.  Reviewed will scanned to chart

## 2022-12-27 NOTE — Assessment & Plan Note (Signed)
History of the same with endoscopy.  Patient is on omeprazole 20 mg daily continue

## 2023-01-01 ENCOUNTER — Encounter: Payer: Self-pay | Admitting: Nurse Practitioner

## 2023-04-10 ENCOUNTER — Other Ambulatory Visit: Payer: Self-pay | Admitting: Nurse Practitioner

## 2023-04-10 NOTE — Telephone Encounter (Signed)
Last Fill: Unknown  Last OV: 12/27/22 Next OV: 06/27/23  Routing to provider for review/authorization.

## 2023-04-10 NOTE — Telephone Encounter (Signed)
Copied from CRM [id number]. Topic: Clinical - Medication Refill >> Apr 10, 2023  4:36 PM Corin V wrote: Most Recent Primary Care Visit:  Provider: Eden Emms  Department: [facility]  Visit Type: NEW PATIENT  Date: 12/27/2022  Medication: hydrochlorothiazide (MICROZIDE) 12.5 MG capsule  Has the patient contacted their pharmacy? No (Agent: If no, request that the patient contact the pharmacy for the refill. If patient does not wish to contact the pharmacy document the reason why and proceed with request.) (Agent: If yes, when and what did the pharmacy advise?)  Is this the correct pharmacy for this prescription? Yes If no, delete pharmacy and type the correct one.  This is the patient's preferred pharmacy:  [facility] [address] [place] - [address] [place] [address] Phone: [phone] Fax: [phone]  Has the prescription been filled recently? No  Is the patient out of the medication? Yes  Has the patient been seen for an appointment in the last year OR does the patient have an upcoming appointment? Yes  Can we respond through MyChart? No  Agent: Please be advised that Rx refills may take up to 3 business days. We ask that you follow-up with your pharmacy.

## 2023-04-11 MED ORDER — HYDROCHLOROTHIAZIDE 12.5 MG PO CAPS: 12.5000 mg | ORAL_CAPSULE | ORAL | 1 refills | Status: DC

## 2023-04-24 ENCOUNTER — Ambulatory Visit: Payer: Medicare HMO | Admitting: Neurosurgery

## 2023-05-03 ENCOUNTER — Other Ambulatory Visit: Payer: Self-pay | Admitting: Nurse Practitioner

## 2023-05-03 NOTE — Telephone Encounter (Signed)
 Copied from CRM [id number]. Topic: Clinical - Medication Refill >> May 03, 2023  8:55 AM Isabell A wrote: Most Recent Primary Care Visit:  Provider: Eden Emms  Department: [facility]  Visit Type: NEW PATIENT  Date: 12/27/2022  Medication: metFORMIN (GLUCOPHAGE) 500 MG tablet   Has the patient contacted their pharmacy? No (Agent: If no, request that the patient contact the pharmacy for the refill. If patient does not wish to contact the pharmacy document the reason why and proceed with request.) (Agent: If yes, when and what did the pharmacy advise?)  Is this the correct pharmacy for this prescription? Yes If no, delete pharmacy and type the correct one.  This is the patient's preferred pharmacy:  [facility] [address] [place] - [address] [place] [address] Phone: [phone] Fax: [phone]  Has the prescription been filled recently? Yes  Is the patient out of the medication? No  Has the patient been seen for an appointment in the last year OR does the patient have an upcoming appointment? Yes  Can we respond through MyChart? Yes  Agent: Please be advised that Rx refills may take up to 3 business days. We ask that you follow-up with your pharmacy.

## 2023-05-04 MED ORDER — METFORMIN HCL 500 MG PO TABS: 500.0000 mg | ORAL_TABLET | Freq: Two times a day (BID) | ORAL | 1 refills | Status: DC

## 2023-05-30 ENCOUNTER — Other Ambulatory Visit: Payer: Self-pay | Admitting: Nurse Practitioner

## 2023-05-30 MED ORDER — OMEPRAZOLE 20 MG PO TBEC: 1.0000 | DELAYED_RELEASE_TABLET | ORAL | 2 refills | Status: DC

## 2023-05-30 NOTE — Telephone Encounter (Signed)
 Copied from CRM [id number]. Topic: Clinical - Medication Refill >> May 30, 2023 10:53 AM Almira Coaster wrote: Most Recent Primary Care Visit:  Provider: Eden Emms  Department: [facility]  Visit Type: NEW PATIENT  Date: 12/27/2022  Medication: Omeprazole 20 MG TBEC  Has the patient contacted their pharmacy? Yes, they don't have a refill. (Agent: If no, request that the patient contact the pharmacy for the refill. If patient does not wish to contact the pharmacy document the reason why and proceed with request.) (Agent: If yes, when and what did the pharmacy advise?)  Is this the correct pharmacy for this prescription? Yes If no, delete pharmacy and type the correct one.  This is the patient's preferred pharmacy:  [facility] [address] [place] - [address] [place] [address] Phone: [phone] Fax: [phone]    Has the prescription been filled recently? No  Is the patient out of the medication? Yes  Has the patient been seen for an appointment in the last year OR does the patient have an upcoming appointment? Yes  Can we respond through MyChart? Yes  Agent: Please be advised that Rx refills may take up to 3 business days. We ask that you follow-up with your pharmacy.

## 2023-05-31 ENCOUNTER — Telehealth: Payer: Self-pay | Admitting: Nurse Practitioner

## 2023-05-31 NOTE — Telephone Encounter (Signed)
 Contacted pt to verify the Omeprazole refill sent out yesterday.  Pt confirmed and stated that she is going to stop by drug store to pick up medication.

## 2023-05-31 NOTE — Telephone Encounter (Signed)
 Copied from CRM [id number]. Topic: General - Call Back - No Documentation >> May 30, 2023 11:00 AM Almira Coaster wrote: Reason for CRM: Patient is returning a call she received from the office; however, no documentation on file. Called [facility] to verify but they are unaware of who called patient.

## 2023-06-26 ENCOUNTER — Encounter: Payer: Medicare HMO | Admitting: Nurse Practitioner

## 2023-06-27 ENCOUNTER — Encounter: Payer: Medicare HMO | Admitting: Nurse Practitioner

## 2023-07-19 ENCOUNTER — Other Ambulatory Visit: Payer: Self-pay | Admitting: Nurse Practitioner

## 2023-07-26 ENCOUNTER — Telehealth: Payer: Self-pay

## 2023-07-26 NOTE — Telephone Encounter (Signed)
 Patient states that she does not remember if she has a reaction to the medrol  dose pack sent in 2023. She wishes not to try it due to her allergy to prednisone.   She will take tylenol  until she sees [employer].

## 2023-07-26 NOTE — Telephone Encounter (Signed)
 Mrs Circle called in. She fell about a week ago in the bathroom. She has a bruise on her leg from hitting the bathtub. She bent down yesterday and felt a catch as she was standing back up. Her pain is in her right lower back and buttock. It is not radiating down her leg unless she gets up from a seated position but once she starts walking the leg pain improves, but not completely. She states she feels like she did not have surgery. She does not have any medication left over from her surgery. Her husband is coming in to see our office today and she asked if she could be seen as well. I explained that we could not see her today, but that we could make her another appointment for another day. I informed her that I would forward a message to [facility] to see if she had any recommendations in the meantime. I scheduled an appointment for Tuesday.

## 2023-07-26 NOTE — Telephone Encounter (Signed)
 Dr Mont Antis suggested a medrol  dosepack if she was able to tolerate the one she had from the ER in 10/2021 (she has prednisone listed as an allergy).

## 2023-07-27 NOTE — Progress Notes (Signed)
   REFERRING PHYSICIAN:  Dorothe Gaster, [address] [place],  [place] [address]  DOS: 08/07/22  L3-L5 lumbar decompression including central laminectomy and bilateral medial facetectomies including foraminotomies   HISTORY OF PRESENT ILLNESS:  She was doing very well at her last visit.   She called last week with some increased pain. She had a fall at the end of May and had bruise on her leg. She bent down on 07/25/23 and felt a catch in her back. She had pain in right lower back and buttock.   Medrol  dose pack recommended, but she declined due to listed allergy to prednisone (hives).   She is here for evaluation.   Since fall, she has 2 week history of constant right sided LBP/buttock pain She has intermittent right lateral thigh pain to her knee. No left leg pain. She has a lot of stiffness in the morning, this improves as she moves around. No numbness, tingling, or weakness. Pain in her back is sharp in nature. Pain is worse with standing and better with nothing.   She has been taking tylenol  arthritis and motrin. She is taking BC powders.   PHYSICAL EXAMINATION:  General: Patient is well developed, well nourished, calm, collected, and in no apparent distress.   NEUROLOGICAL:  General: In no acute distress.   Awake, alert, oriented to person, place, and time.  Pupils equal round and reactive to light.  Facial tone is symmetric.     Strength:    Side Iliopsoas Quads Hamstring PF DF EHL  R 5 5 5 5 5 5   L 5 5 5 5 5 5    Incision well healed. Mild right sided lower lumbar tenderness.   No pain with IR/ER of both hips.   Sensation is intact to light touch in both legs.   ROS (Neurologic):  Negative except as noted above  IMAGING: Nothing new to review.   ASSESSMENT/PLAN:  Becky Gordon is having more constant  right sided LBP/buttock pain with intermittent right lateral thigh pain to her knee. No left leg pain. She has a lot of stiffness in the morning, this  improves as she moves around. No numbness, tingling, or weakness.   No recent imaging.   Treatment options reviewed with patient and following plan made:    - Xrays of lumbar spine with flex/ext. Will message her with results.  - PT for lumbar spine. Orders to [facility].  - Continue on prn OTC tylenol . Reviewed dosing and side effects.  - Try to limit BC powders. Take with food and stop if any GI upset.  - New prescription for robaxin  to use prn spasms. Reviewed dosing and side effects. This can make her sleepy.  - Follow up with me in 6 weeks. If no improvement, may need to get updated lumbar MRI and consider injections.   Advised to contact the office if any questions or concerns arise.  Lucetta Russel PA-C Department of neurosurgery

## 2023-07-31 ENCOUNTER — Ambulatory Visit: Admitting: Orthopedic Surgery

## 2023-07-31 ENCOUNTER — Ambulatory Visit
Admission: RE | Admit: 2023-07-31 | Discharge: 2023-07-31 | Disposition: A | Discharge status: Home / Self Care | Attending: Orthopedic Surgery | Admitting: Orthopedic Surgery

## 2023-07-31 ENCOUNTER — Encounter: Payer: Self-pay | Admitting: Orthopedic Surgery

## 2023-07-31 ENCOUNTER — Ambulatory Visit
Admission: RE | Admit: 2023-07-31 | Discharge: 2023-07-31 | Disposition: A | Discharge status: Home / Self Care | Source: Ambulatory Visit | Attending: Orthopedic Surgery | Admitting: Orthopedic Surgery

## 2023-07-31 VITALS — BP 130/78 | Ht 65.0 in | Wt 200.8 lb

## 2023-07-31 DIAGNOSIS — M5416 Radiculopathy, lumbar region: Secondary | ICD-10-CM

## 2023-07-31 DIAGNOSIS — M545 Low back pain, unspecified: Secondary | ICD-10-CM | POA: Diagnosis not present

## 2023-07-31 DIAGNOSIS — Z9889 Other specified postprocedural states: Secondary | ICD-10-CM | POA: Diagnosis present

## 2023-07-31 DIAGNOSIS — M47816 Spondylosis without myelopathy or radiculopathy, lumbar region: Secondary | ICD-10-CM | POA: Insufficient documentation

## 2023-07-31 MED ORDER — METHOCARBAMOL 500 MG PO TABS: 500.0000 mg | ORAL_TABLET | Freq: Three times a day (TID) | ORAL | 0 refills | Status: DC | PRN

## 2023-07-31 NOTE — Patient Instructions (Signed)
 It was so nice to see you today. Thank you so much for coming in.    I ordered xrays of your lower back. You can get these at [facility] (building with the [facility]) off of [address]. The address is [address] [place], [place] [address]. You do not need any appointment. I will message you with results.   I sent physical therapy orders to [facility]. You can call them at [phone] if you don't hear from them to schedule your visit.   Take over the counter tylenol  as directed on bottle to help with pain and inflammation. Take with food. Do not take more than 3000mg  in 24 hours.   I also sent a prescription for methocarbaol to help with muscle spasms. Use only as needed and be careful, this can make you sleepy.    I will see you back in 6 weeks. Please do not hesitate to call if you have any questions or concerns. You can also message me in MyChart.   Lucetta Russel PA-C [phone]     The physicians and staff at [facility] at [facility] are committed to providing excellent care. You may receive a survey asking for feedback about your experience at our office. We value you your feedback and appreciate you taking the time to to fill it out. The [facility] leadership team is also available to discuss your experience in person, feel free to contact us  [phone].

## 2023-08-05 ENCOUNTER — Other Ambulatory Visit: Payer: Self-pay | Admitting: Nurse Practitioner

## 2023-08-06 ENCOUNTER — Encounter: Payer: Self-pay | Admitting: Orthopedic Surgery

## 2023-08-06 NOTE — Telephone Encounter (Signed)
 Lumbar xrays dated 07/31/23:  FINDINGS: Slight left convex lumbar curve. Chronic grade 1 anterolisthesis of L5. No evidence of acute listhesis. No acute fracture. Disc space height loss is mild at L3-L4 and L4-L5 and moderate at L5-S1. Moderate lower lumbar facet arthropathy at L4-L5 and L5-S1. No evidence of instability on flexion or extension.   IMPRESSION: 1. No acute fracture. 2. Chronic grade 1 anterolisthesis of L5. No evidence of instability on flexion or extension. 3. 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     Electronically Signed   By: Rozell Cornet M.D.   On: 08/04/2023 22:00  I have personally reviewed the images and agree with the above interpretation.

## 2023-09-07 NOTE — Progress Notes (Deleted)
   REFERRING PHYSICIAN:  Wendee Lynwood HERO, [address] [place],  [place] [address]  DOS: 08/07/22  L3-L5 lumbar decompression including central laminectomy and bilateral medial facetectomies including foraminotomies   HISTORY OF PRESENT ILLNESS:  Last seen by me on 07/31/23 with more constant right sided LBP/buttock pain with intermittent right lateral thigh pain to her knee. No left leg pain.   She has known slip L5-S1 with DDD and no instability.   She was sent to PT- did one visit on 08/06/23 and cancelled saying she was feeling better.   She is her for follow up.      She called last week with some increased pain. She had a fall at the end of May and had bruise on her leg. She bent down on 07/25/23 and felt a catch in her back. She had pain in right lower back and buttock.   Medrol  dose pack recommended, but she declined due to listed allergy to prednisone (hives).   She is here for evaluation.   Since fall, she has 2 week history of constant right sided LBP/buttock pain She has intermittent right lateral thigh pain to her knee. No left leg pain. She has a lot of stiffness in the morning, this improves as she moves around. No numbness, tingling, or weakness. Pain in her back is sharp in nature. Pain is worse with standing and better with nothing.   She has been taking tylenol  arthritis and motrin. She is taking BC powders.   PHYSICAL EXAMINATION:  General: Patient is well developed, well nourished, calm, collected, and in no apparent distress.   NEUROLOGICAL:  General: In no acute distress.   Awake, alert, oriented to person, place, and time.  Pupils equal round and reactive to light.  Facial tone is symmetric.     Strength:    Side Iliopsoas Quads Hamstring PF DF EHL  R 5 5 5 5 5 5   L 5 5 5 5 5 5    Incision well healed. Mild right sided lower lumbar tenderness.   No pain with IR/ER of both hips.   Sensation is intact to light touch in both legs.   ROS  (Neurologic):  Negative except as noted above  IMAGING: Nothing new to review.   ASSESSMENT/PLAN:  DEONNE ROOKS is having more constant  right sided LBP/buttock pain with intermittent right lateral thigh pain to her knee. No left leg pain. She has a lot of stiffness in the morning, this improves as she moves around. No numbness, tingling, or weakness.   No recent imaging.   Treatment options reviewed with patient and following plan made:    - Xrays of lumbar spine with flex/ext. Will message her with results.  - PT for lumbar spine. Orders to Renew.  - Continue on prn OTC tylenol . Reviewed dosing and side effects.  - Try to limit BC powders. Take with food and stop if any GI upset.  - New prescription for robaxin  to use prn spasms. Reviewed dosing and side effects. This can make her sleepy.  - Follow up with me in 6 weeks. If no improvement, may need to get updated lumbar MRI and consider injections.   Advised to contact the office if any questions or concerns arise.  Glade Boys PA-C Department of neurosurgery

## 2023-09-12 ENCOUNTER — Ambulatory Visit: Admitting: Orthopedic Surgery

## 2023-09-13 ENCOUNTER — Encounter: Admitting: Nurse Practitioner

## 2023-11-02 ENCOUNTER — Telehealth: Payer: Self-pay | Admitting: Nurse Practitioner

## 2023-11-02 NOTE — Telephone Encounter (Signed)
 Copied from CRM [id number]. Topic: Clinical - Medication Refill >> Nov 02, 2023 12:00 PM [name] wrote: Medication: metFORMIN  (GLUCOPHAGE ) 500 MG tablet  Has the patient contacted their pharmacy? Yes (Agent: If no, request that the patient contact the pharmacy for the refill. If patient does not wish to contact the pharmacy document the reason why and proceed with request.) (Agent: If yes, when and what did the pharmacy advise?)  This is the patient's preferred pharmacy:  [facility] [address] [place] - [name] [place] [address] Phone: [phone] Fax: [phone]  Is this the correct pharmacy for this prescription? Yes If no, delete pharmacy and type the correct one.   Has the prescription been filled recently? Yes  Is the patient out of the medication? No  Has the patient been seen for an appointment in the last year OR does the patient have an upcoming appointment? Yes  Can we respond through MyChart? No  Agent: Please be advised that Rx refills may take up to 3 business days. We ask that you follow-up with your pharmacy.

## 2024-01-23 ENCOUNTER — Ambulatory Visit: Admitting: Nurse Practitioner

## 2024-01-23 ENCOUNTER — Encounter: Payer: Self-pay | Admitting: Nurse Practitioner

## 2024-01-23 VITALS — BP 138/92 | HR 67 | Temp 97.9°F | Ht 64.0 in | Wt 198.8 lb

## 2024-01-23 DIAGNOSIS — Z1231 Encounter for screening mammogram for malignant neoplasm of breast: Secondary | ICD-10-CM

## 2024-01-23 DIAGNOSIS — Z0000 Encounter for general adult medical examination without abnormal findings: Secondary | ICD-10-CM | POA: Diagnosis not present

## 2024-01-23 DIAGNOSIS — K2270 Barrett's esophagus without dysplasia: Secondary | ICD-10-CM | POA: Diagnosis not present

## 2024-01-23 DIAGNOSIS — Z87891 Personal history of nicotine dependence: Secondary | ICD-10-CM | POA: Diagnosis not present

## 2024-01-23 DIAGNOSIS — E1159 Type 2 diabetes mellitus with other circulatory complications: Secondary | ICD-10-CM | POA: Diagnosis not present

## 2024-01-23 DIAGNOSIS — K209 Esophagitis, unspecified without bleeding: Secondary | ICD-10-CM

## 2024-01-23 DIAGNOSIS — E785 Hyperlipidemia, unspecified: Secondary | ICD-10-CM | POA: Diagnosis not present

## 2024-01-23 DIAGNOSIS — Z13820 Encounter for screening for osteoporosis: Secondary | ICD-10-CM

## 2024-01-23 DIAGNOSIS — E118 Type 2 diabetes mellitus with unspecified complications: Secondary | ICD-10-CM | POA: Diagnosis not present

## 2024-01-23 DIAGNOSIS — Z7984 Long term (current) use of oral hypoglycemic drugs: Secondary | ICD-10-CM

## 2024-01-23 DIAGNOSIS — I152 Hypertension secondary to endocrine disorders: Secondary | ICD-10-CM | POA: Diagnosis not present

## 2024-01-23 LAB — CBC WITH DIFFERENTIAL/PLATELET
Basophils Absolute: 0.1 K/uL (ref 0.0–0.1)
Basophils Relative: 1.1 % (ref 0.0–3.0)
Eosinophils Absolute: 0.3 K/uL (ref 0.0–0.7)
Eosinophils Relative: 5.4 % — ABNORMAL HIGH (ref 0.0–5.0)
HCT: 39.2 % (ref 36.0–46.0)
Hemoglobin: 13.4 g/dL (ref 12.0–15.0)
Lymphocytes Relative: 32.8 % (ref 12.0–46.0)
Lymphs Abs: 1.8 K/uL (ref 0.7–4.0)
MCHC: 34.1 g/dL (ref 30.0–36.0)
MCV: 84.5 fl (ref 78.0–100.0)
Monocytes Absolute: 0.5 K/uL (ref 0.1–1.0)
Monocytes Relative: 8.4 % (ref 3.0–12.0)
Neutro Abs: 2.8 K/uL (ref 1.4–7.7)
Neutrophils Relative %: 52.3 % (ref 43.0–77.0)
Platelets: 274 K/uL (ref 150.0–400.0)
RBC: 4.64 Mil/uL (ref 3.87–5.11)
RDW: 13.1 % (ref 11.5–15.5)
WBC: 5.4 K/uL (ref 4.0–10.5)

## 2024-01-23 LAB — COMPREHENSIVE METABOLIC PANEL WITH GFR
ALT: 20 U/L (ref 0–35)
AST: 21 U/L (ref 0–37)
Albumin: 4.4 g/dL (ref 3.5–5.2)
Alkaline Phosphatase: 75 U/L (ref 39–117)
BUN: 18 mg/dL (ref 6–23)
CO2: 30 meq/L (ref 19–32)
Calcium: 9.7 mg/dL (ref 8.4–10.5)
Chloride: 100 meq/L (ref 96–112)
Creatinine, Ser: 0.99 mg/dL (ref 0.40–1.20)
GFR: 54.36 mL/min — ABNORMAL LOW (ref >60.00)
Glucose, Bld: 108 mg/dL — ABNORMAL HIGH (ref 70–99)
Potassium: 4.5 meq/L (ref 3.5–5.1)
Sodium: 139 meq/L (ref 135–145)
Total Bilirubin: 0.4 mg/dL (ref 0.2–1.2)
Total Protein: 7.5 g/dL (ref 6.0–8.3)

## 2024-01-23 LAB — URINALYSIS, MICROSCOPIC ONLY

## 2024-01-23 LAB — POCT GLYCOSYLATED HEMOGLOBIN (HGB A1C): Hemoglobin A1C: 7 % — AB (ref 4.0–5.6)

## 2024-01-23 LAB — LIPID PANEL
Cholesterol: 252 mg/dL — ABNORMAL HIGH (ref 0–200)
HDL: 36.5 mg/dL — ABNORMAL LOW (ref >39.00)
LDL Cholesterol: 159 mg/dL — ABNORMAL HIGH (ref 0–99)
NonHDL: 215.22
Total CHOL/HDL Ratio: 7
Triglycerides: 280 mg/dL — ABNORMAL HIGH (ref 0.0–149.0)
VLDL: 56 mg/dL — ABNORMAL HIGH (ref 0.0–40.0)

## 2024-01-23 LAB — MICROALBUMIN / CREATININE URINE RATIO
Creatinine,U: 121.7 mg/dL
Microalb Creat Ratio: 6.2 mg/g (ref 0.0–30.0)
Microalb, Ur: 0.8 mg/dL (ref 0.0–1.9)

## 2024-01-23 LAB — TSH: TSH: 3.16 u[IU]/mL (ref 0.35–5.50)

## 2024-01-23 NOTE — Assessment & Plan Note (Signed)
 Discussed age-appropriate immunization and screening exams.  Did review patient's personal, surgical, social, family histories.  Patient is up-to-date on all age-appropriate vaccinations she would like.  Patient declined flu, shingles, pneumonia vaccine today.  Patient is up-to-date on CRC screening and aged out.  Orders placed for breast cancer screening and osteoporosis screening.  Patient is aged out of cervical cancer screening.  Patient was given information at discharge about preventative healthcare maintenance with anticipatory guidance

## 2024-01-23 NOTE — Patient Instructions (Addendum)
 Nice to see you today  I want to see you in 3 months to recheck your blood pressure I will be in touch with the labs once I have them   Call and schedule your mammogram and bone density scan at   [facility] [address] ( on hospital grounds) [place], [place]  [phone]

## 2024-01-23 NOTE — Assessment & Plan Note (Signed)
 History of same currently maintained on omeprazole  20 mg daily.  Continue

## 2024-01-23 NOTE — Assessment & Plan Note (Signed)
 History of the same.  Will check urine microscopy rule out microscopic hematuria

## 2024-01-23 NOTE — Assessment & Plan Note (Signed)
 Patient currently maintained on metformin  500 mg twice daily.  Does have supplies to check glucose at home will check infrequently.  Patient's A1c well-controlled continue metformin  as prescribed

## 2024-01-23 NOTE — Assessment & Plan Note (Signed)
 History of the same pending lipid panel today

## 2024-01-23 NOTE — Progress Notes (Signed)
 Established Patient Office Visit  Subjective   Patient ID: Becky Gordon, female    DOB: 1945-07-08  Age: 78 y.o. MRN: [id number]  Chief Complaint  Patient presents with   Annual Exam    HPI  DM2: Patient currently maintained on metformin  500 mg twice daily.  Has supplies to check glucose at home but does not check her sugar at home.   HTN: Patient currently mains on hydrochlorothiazide  12.5 mg daily.  Does not check blood pressure at home  GERD/Barrett's esophagus: Patient currently maintained on omeprazole  20 mg daily.  for complete physical and follow up of chronic conditions.  Immunizations: -Tetanus: Within 10 years -Influenza: Refused -Shingles: Refused -Pneumonia: Refused  Diet: Fair diet. She is eating breakfast, small lunch and then something for dinner that could be take out. She is drinking coffee and pepsi Exercise: No regular exercise. Helping out with spouse   Eye exam: Completes annually. Needs updating  and wears glasses Dental exam: needs updating   Colonoscopy: Completed in 06/05/2016, aged out Lung Cancer Screening: N/A  Pap smear: Aged out  Mammogram: 11/03/2022.  Order placed  DEXA: Order placed for [place]   Sleep: going to bed around 9 and will get up around 6-7. She will get up a few times      Review of Systems  Constitutional:  Negative for chills and fever.  Respiratory:  Negative for shortness of breath.   Cardiovascular:  Negative for chest pain and leg swelling.  Gastrointestinal:  Negative for abdominal pain, blood in stool, constipation, diarrhea, nausea and vomiting.       BM daily   Genitourinary:  Negative for dysuria and hematuria.  Neurological:  Negative for dizziness, tingling and headaches.  Psychiatric/Behavioral:  Negative for hallucinations and suicidal ideas.       Objective:     BP (!) 138/92   Pulse 67   Temp 97.9 F (36.6 C) (Oral)   Ht 5' 4 (1.626 m)   Wt 198 lb 12.8 oz (90.2 kg)   SpO2 98%   BMI  34.12 kg/m  BP Readings from Last 3 Encounters:  01/23/24 (!) 138/92  07/31/23 130/78  12/27/22 120/88   Wt Readings from Last 3 Encounters:  01/23/24 198 lb 12.8 oz (90.2 kg)  07/31/23 200 lb 12.8 oz (91.1 kg)  12/27/22 200 lb 12.8 oz (91.1 kg)   SpO2 Readings from Last 3 Encounters:  01/23/24 98%  12/27/22 95%  08/07/22 95%      Physical Exam Vitals and nursing note reviewed.  Constitutional:      Appearance: Normal appearance.  HENT:     Right Ear: Tympanic membrane, ear canal and external ear normal.     Left Ear: Tympanic membrane, ear canal and external ear normal.     Mouth/Throat:     Mouth: Mucous membranes are moist.     Pharynx: Oropharynx is clear.  Eyes:     Extraocular Movements: Extraocular movements intact.     Pupils: Pupils are equal, round, and reactive to light.  Cardiovascular:     Rate and Rhythm: Normal rate and regular rhythm.     Pulses: Normal pulses.     Heart sounds: Normal heart sounds.  Pulmonary:     Effort: Pulmonary effort is normal.     Breath sounds: Normal breath sounds.  Abdominal:     General: Bowel sounds are normal. There is no distension.     Palpations: There is no mass.     Tenderness:  There is no abdominal tenderness.     Hernia: No hernia is present.  Musculoskeletal:     Right lower leg: No edema.     Left lower leg: No edema.  Lymphadenopathy:     Cervical: No cervical adenopathy.  Skin:    General: Skin is warm.  Neurological:     General: No focal deficit present.     Mental Status: She is alert.     Deep Tendon Reflexes:     Reflex Scores:      Bicep reflexes are 2+ on the right side and 2+ on the left side.      Patellar reflexes are 2+ on the right side and 2+ on the left side.    Comments: Bilateral upper and lower extremity strength 5/5  Psychiatric:        Mood and Affect: Mood normal.        Behavior: Behavior normal.        Thought Content: Thought content normal.        Judgment: Judgment normal.      Title   Diabetic Foot Exam - detailed Is there a history of foot ulcer?: No Is there a foot ulcer now?: No Is there swelling?: No Is there elevated skin temperature?: No Is there abnormal foot shape?: No Is there a claw toe deformity?: No Are the toenails long?: No Are the toenails thick?: No Are the toenails ingrown?: No Pulse Foot Exam completed.: Yes   Right Posterior Tibialis: Present Left posterior Tibialis: Present   Right Dorsalis Pedis: Present Left Dorsalis Pedis: Present     Sensory Foot Exam Completed.: Yes Semmes-Weinstein Monofilament Test + means has sensation and - means no sensation      Image components are not supported.   Image components are not supported. Image components are not supported.  Tuning [place]ork Comments All 10 site tested sensation intact bilaterally     Results for orders placed or performed in visit on 01/23/24  POCT glycosylated hemoglobin (Hb A1C)  Result Value Ref Range   Hemoglobin A1C 7.0 (A) 4.0 - 5.6 %   HbA1c POC (<> result, manual entry)     HbA1c, POC (prediabetic range)     HbA1c, POC (controlled diabetic range)        The 10-year ASCVD risk score (Arnett DK, et al., 2019) is: 43%* (Cholesterol units were assumed)    Assessment & Plan:   Problem List Items Addressed This Visit       Cardiovascular and Mediastinum   Hypertension associated with diabetes (HCC)   Patient currently maintained on hydrochlorothiazide  12.5 mg daily.  Blood pressure elevated upon initial and recheck.  Historically blood pressures have been well-controlled.  Will use antihypertensive regimen the same and have follow-up in 3 months.      Relevant Orders   CBC with Differential/Platelet   Comprehensive metabolic panel with GFR   TSH   Lipid panel     Digestive   Barrett's esophagus with esophagitis   History of same currently maintained on omeprazole  20 mg daily.  Continue        Endocrine   Controlled type 2 diabetes  mellitus with complication, without long-term current use of insulin (HCC)   Patient currently maintained on metformin  500 mg twice daily.  Does have supplies to check glucose at home will check infrequently.  Patient's A1c well-controlled continue metformin  as prescribed      Relevant Orders   POCT glycosylated hemoglobin (Hb A1C) (Completed)  CBC with Differential/Platelet   Comprehensive metabolic panel with GFR   TSH   Lipid panel   Microalbumin / creatinine urine ratio     Other   Former smoker   History of the same.  Will check urine microscopy rule out microscopic hematuria      Relevant Orders   Urine Microscopic   HLD (hyperlipidemia)   History of the same pending lipid panel today      Relevant Orders   Lipid panel   Preventative health care - Primary   Discussed age-appropriate immunization and screening exams.  Did review patient's personal, surgical, social, family histories.  Patient is up-to-date on all age-appropriate vaccinations she would like.  Patient declined flu, shingles, pneumonia vaccine today.  Patient is up-to-date on CRC screening and aged out.  Orders placed for breast cancer screening and osteoporosis screening.  Patient is aged out of cervical cancer screening.  Patient was given information at discharge about preventative healthcare maintenance with anticipatory guidance      Relevant Orders   CBC with Differential/Platelet   Comprehensive metabolic panel with GFR   TSH   Other Visit Diagnoses       Screening mammogram for breast cancer       Relevant Orders   MM 3D SCREENING MAMMOGRAM BILATERAL BREAST     Screening for osteoporosis       Relevant Orders   DG Bone Density       Return in about 3 months (around 04/22/2024) for BP recheck.    Adina Crandall, NP

## 2024-01-23 NOTE — Assessment & Plan Note (Signed)
 Patient currently maintained on hydrochlorothiazide  12.5 mg daily.  Blood pressure elevated upon initial and recheck.  Historically blood pressures have been well-controlled.  Will use antihypertensive regimen the same and have follow-up in 3 months.

## 2024-01-25 ENCOUNTER — Ambulatory Visit: Payer: Self-pay | Admitting: Nurse Practitioner

## 2024-02-04 ENCOUNTER — Other Ambulatory Visit: Payer: Self-pay | Admitting: Nurse Practitioner

## 2024-02-04 NOTE — Telephone Encounter (Unsigned)
 Copied from CRM #[id number]. Topic: Clinical - Medication Refill >> Feb 04, 2024  2:00 PM Savanna F wrote: Medication:  Omeprazole  20 MG TBEC metFORMIN  (GLUCOPHAGE ) 500 MG tablet   Has the patient contacted their pharmacy? No, states they said to call us  (Agent: If no, request that the patient contact the pharmacy for the refill. If patient does not wish to contact the pharmacy document the reason why and proceed with request.) (Agent: If yes, when and what did the pharmacy advise?)  This is the patient's preferred pharmacy:  [facility] [address], [place] - [name] [place] [address] Phone: [phone] Fax: [phone]  Is this the correct pharmacy for this prescription? Yes If no, delete pharmacy and type the correct one.   Has the prescription been filled recently? No  Is the patient out of the medication? No  Has the patient been seen for an appointment in the last year OR does the patient have an upcoming appointment? Yes  Can we respond through MyChart? Yes  Agent: Please be advised that Rx refills may take up to 3 business days. We ask that you follow-up with your pharmacy.

## 2024-02-05 MED ORDER — OMEPRAZOLE 20 MG PO TBEC: 1.0000 | DELAYED_RELEASE_TABLET | ORAL | 2 refills | Status: AC

## 2024-02-05 MED ORDER — METFORMIN HCL 500 MG PO TABS: 500.0000 mg | ORAL_TABLET | Freq: Two times a day (BID) | ORAL | 1 refills | Status: AC

## 2024-03-06 ENCOUNTER — Ambulatory Visit
Admission: RE | Admit: 2024-03-06 | Discharge: 2024-03-06 | Disposition: A | Source: Ambulatory Visit | Attending: Nurse Practitioner

## 2024-03-06 ENCOUNTER — Ambulatory Visit
Admission: RE | Admit: 2024-03-06 | Discharge: 2024-03-06 | Disposition: A | Discharge status: Home / Self Care | Source: Ambulatory Visit | Attending: Nurse Practitioner | Admitting: Nurse Practitioner

## 2024-03-06 DIAGNOSIS — Z13820 Encounter for screening for osteoporosis: Secondary | ICD-10-CM | POA: Insufficient documentation

## 2024-03-06 DIAGNOSIS — Z780 Asymptomatic menopausal state: Secondary | ICD-10-CM | POA: Insufficient documentation

## 2024-03-06 DIAGNOSIS — Z1231 Encounter for screening mammogram for malignant neoplasm of breast: Secondary | ICD-10-CM | POA: Insufficient documentation

## 2024-03-27 ENCOUNTER — Other Ambulatory Visit: Payer: Self-pay | Admitting: Nurse Practitioner

## 2024-04-23 ENCOUNTER — Ambulatory Visit: Admitting: Nurse Practitioner

## 2024-05-29 ENCOUNTER — Ambulatory Visit: Admitting: Nurse Practitioner
